# Patient Record
Sex: Male | Born: 1990
Health system: Southern US, Community
[De-identification: ages and names within clinical notes are randomized; demographics above are authoritative.]

## PROBLEM LIST (undated history)

## (undated) DIAGNOSIS — I10 Essential (primary) hypertension: Secondary | ICD-10-CM

## (undated) DIAGNOSIS — M549 Dorsalgia, unspecified: Secondary | ICD-10-CM

## (undated) HISTORY — PX: HIP SURGERY: SHX245

## (undated) HISTORY — PX: SHOULDER SURGERY: SHX246

---
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
## 2010-07-18 NOTE — Op Note (Signed)
NAME:  Samuel Greer, Samuel Greer NO.:  [id number]   MEDICAL RECORD NO.:  [id number]          PATIENT TYPE:  AMB   LOCATION:  DSC                          FACILITY:  [facility]   PHYSICIAN:  Loreta Ave, M.D. 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   PROCEDURE:  Left shoulder exam under anesthesia, arthroscopy,  debridement of articular cartilage loose bodies glenohumeral joint,  Bankart reconstruction utilizing Fiberlink suture x3 and pushed lock  anchor x3.   SURGEON:  Loreta Ave, M.D.   ASSISTANT:  Genene Churn. Barry Dienes, P.A.-C., present throughout the entire case.   ANESTHESIA:  General.   BLOOD LOSS:  Minimal.   SPECIMENS:  None.   COMPLICATIONS:  None.   DRESSINGS:  Soft compressive with shoulder immobilizer.   PROCEDURE IN DETAIL:  The patient was brought to the operating room and  placed on operating table in a supine position.  After adequate  anesthesia had been obtained, the left shoulder was examined.  Obvious  increased external rotation and anteroinferior instability left compared  right.  Placed in a beach chair position on the shoulder positioner and  prepped and draped in the usual sterile fashion.  Anterior and posterior  shoulder portals were created.  Shoulder entered with blunt obturator,  arthroscope, distended and inspected.  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  Remnants of labrum debrided.  Articular cartilage irregularity and loose bodies debrided off the  glenoid.  A relatively large Hill-Sachs lesion posterior humeral head  debrided.  Biceps tendon, biceps anchor, and rotator cuff intact.  The  front of the glenoid was roughened 1-2 cm down the neck where the  Bankart lesion was present.  This was done to heal bleeding bone.  I  then mobilized the capsular labral interface.  Utilizing the lasso  device, the capsular labral interface was captured with three Fiberlink  sutures.  These were then firmly brought up and into the front of the  glenoid and sutured in place on the front of the glenoid at the 1, 3,  and 5 o'clock position.  Sutures were cut flush.  At completion, I had  marked improved stability, still good motion, and a very nicely  reinforced anterior wall to obliterate the Bankart lesion.  Instruments  and fluid were all removed.  The large cannula  from the front of the shoulder removed.  The wound was irrigated and  closed with nylon.  The shoulder was injected with Marcaine.  Sterile  compressive dressing applied.  Shoulder immobilizer applied.  Anesthesia  reversed.  Brought to the recovery room.  Tolerated surgery well.  No  complications.      Loreta Ave, M.D.  Electronically Signed     DFM/MEDQ  D:  11/07/2006  T:  11/07/2006  Job:  [id number]

## 2010-07-21 NOTE — Discharge Summary (Signed)
[facility]  Patient:    Samuel Greer, Samuel Greer                      MRN: [id number] Adm. Date:  [id number] Disc. Date: [id number] Attending:  Colbert Ewing Dictator:   Oris Drone. Petrarca, P.A.-C.                           Discharge Summary  ADMITTING DIAGNOSIS:  Bilateral subcapital femoral epiphysis.  DISCHARGE DIAGNOSES: 1. Bilateral subcapital femoral epiphysis. 2. Exogenous obesity.  PROCEDURES:  Bilateral internal fixation of both hips.  HISTORY OF PRESENT ILLNESS:  This patient has had pain in both hips which was noted previously.  X-rays revealed subcapital femoral epiphysis and he was scheduled at this time for pinning.  HOSPITAL COURSE:  A 20 year old black male admitted Jul 19, 2000, and after appropriate laboratory studies were obtained, he was taken to the operating room where he underwent bilateral pinning of the subcapital femoral epiphysis. He tolerated the procedure well.  He was kept preoperatively and postoperatively on Ancef prophylaxis for one more dose postoperatively.  Consultation for PT for ambulation, touchdown weightbearing on the left with crutches or walker.  He had difficulty with ambulation and unfortunately it had took until the 20th of May to get him ambulatory.  He was complaining of significant pain in both his hips and inability to ambulate.  X-rays revealed no reveal change in the position of the epiphysis.  He was then able to ambulate with the physical therapist and was discharged on the 20th.  Laboratory studies showed a urinalysis which is benign.  DISCHARGE MEDICATIONS:  He was given a prescription for Tylenol No. 3 syrup, give three teaspoons q.4h. p.r.n. pain.  DISCHARGE INSTRUCTIONS:  Ambulate with crutches and walker.  Keep his dressing clean and dry until follow-up.  Call for follow-up in 7 to 10 days for recheck evaluation.  He was discharged on a house diet, though we have discussed calorie  reduction.  He was discharged in improved condition.DD:  07/29/00 TD:  07/29/00 Job: [id number]

## 2010-11-29 ENCOUNTER — Emergency Department (HOSPITAL_COMMUNITY)
Admission: EM | Admit: 2010-11-29 | Discharge: 2010-11-29 | Disposition: A | Discharge status: Home / Self Care | Payer: Self-pay | Attending: Emergency Medicine | Admitting: Emergency Medicine

## 2010-11-29 DIAGNOSIS — R51 Headache: Secondary | ICD-10-CM | POA: Insufficient documentation

## 2010-12-15 LAB — POCT HEMOGLOBIN-HEMACUE: Hemoglobin: 14.5

## 2011-02-16 ENCOUNTER — Emergency Department (HOSPITAL_COMMUNITY)
Admission: EM | Admit: 2011-02-16 | Discharge: 2011-02-16 | Disposition: A | Discharge status: Home / Self Care | Payer: Self-pay | Attending: Emergency Medicine | Admitting: Emergency Medicine

## 2011-02-16 ENCOUNTER — Encounter: Payer: Self-pay | Admitting: Emergency Medicine

## 2011-02-16 DIAGNOSIS — H109 Unspecified conjunctivitis: Secondary | ICD-10-CM | POA: Insufficient documentation

## 2011-02-16 DIAGNOSIS — H5789 Other specified disorders of eye and adnexa: Secondary | ICD-10-CM | POA: Insufficient documentation

## 2011-02-16 DIAGNOSIS — F17200 Nicotine dependence, unspecified, uncomplicated: Secondary | ICD-10-CM | POA: Insufficient documentation

## 2011-02-16 MED ORDER — TOBRAMYCIN 0.3 % OP SOLN: 1.0000 [drp] | OPHTHALMIC | Status: AC

## 2011-02-16 NOTE — ED Provider Notes (Signed)
History     CSN: [id number] Arrival date & time: 02/16/2011 10:20 PM   First MD Initiated Contact with Patient 02/16/11 2252      Chief Complaint  Patient presents with  . Eye Drainage    (Consider location/radiation/quality/duration/timing/severity/associated sxs/prior treatment) HPI  Patient presents to emergency department complaining of a 2 day history of red watery right eye. patient states he woke yesterday morning with drainage crusted to his right eye and noted his eyes are watering throughout the day yesterday and today. Patient states he woke again this morning with drainage crusted into his right eye that he removed with warm wash rag. Patient states throughout the day his eyes felt irritated however he denies any associated pain. States clear to white watery discharge from eye. Patient states that a few days prior he had cough and cold like symptoms stating scratchy throat, runny nose, and mild cough. Patient denies fevers, chills, facial swelling, headache, visual changes, pain with eye movement. Patient denies contact lens use or corrective lens use. Patient denies injury to eye. Past Medical History  Diagnosis Date  . Migraine     Past Surgical History  Procedure Date  . Hip surgery     Family History  Problem Relation Age of Onset  . Cancer Other   . Diabetes Other     History  Substance Use Topics  . Smoking status: Current Everyday Smoker -- 0.5 packs/day    Types: Cigarettes  . Smokeless tobacco: Not on file  . Alcohol Use: Yes     social      Review of Systems  All other systems reviewed and are negative.    Allergies  Review of patient's allergies indicates no known allergies.  Home Medications   Current Outpatient Rx  Name Route Sig Dispense Refill  . DM-GUAIFENESIN ER 30-600 MG PO TB12 Oral Take 1 tablet by mouth every 12 (twelve) hours.      Dorothyann Peng MULTI-SYMPTOM COLD/FLU PO Oral Take 2 capsules by mouth 1 day or 1 dose.        BP  125/76  Pulse 85  Temp(Src) 98.7 F (37.1 C) (Oral)  Resp 18  SpO2 99%  Physical Exam  Nursing note and vitals reviewed. Constitutional: He is oriented to person, place, and time. He appears well-developed and well-nourished. No distress.  HENT:  Head: Normocephalic and atraumatic.  Right Ear: External ear normal.  Left Ear: External ear normal.  Mouth/Throat: Oropharynx is clear and moist.  Eyes: EOM are normal. Pupils are equal, round, and reactive to light. Right eye exhibits discharge. Right conjunctiva is injected. Right conjunctiva has no hemorrhage. Left conjunctiva is not injected. Left conjunctiva has no hemorrhage.       Clear watery drainage to injected right eye.   Neck: Normal range of motion. Neck supple.  Cardiovascular: Normal rate, regular rhythm, normal heart sounds and intact distal pulses.  Exam reveals no gallop and no friction rub.   No murmur heard. Pulmonary/Chest: Effort normal and breath sounds normal. No respiratory distress. He has no wheezes. He has no rales. He exhibits no tenderness.  Musculoskeletal: Normal range of motion. He exhibits no edema and no tenderness.  Lymphadenopathy:    He has no cervical adenopathy.  Neurological: He is alert and oriented to person, place, and time.  Skin: Skin is warm and dry. No rash noted. He is not diaphoretic. No erythema.  Psychiatric: He has a normal mood and affect.    ED Course  Procedures (  including critical care time)  Labs Reviewed - No data to display No results found.   1. Conjunctivitis       MDM  No injury to eye with preceding upper respiratory like illness with likely viral conjunctivitis but will treat covering for both viral and bacterial. Patient has no pain with EOMIs and PERRLA without pain. Clear watery drainage.        Jenness Corner, [place] 02/16/11 2328

## 2011-02-16 NOTE — Discharge Instructions (Signed)
Use eyedrops as directed and use cool compresses as needed for comfort. Followup with eye doctor in the next 3-5 days for recheck of ongoing symptoms. However return to emergency department for emergent changing or worsening symptoms.  Conjunctivitis Conjunctivitis is commonly called "pink eye." Conjunctivitis can be caused by bacterial or viral infection, allergies, or injuries. There is usually redness of the lining of the eye, itching, discomfort, and sometimes discharge. There may be deposits of matter along the eyelids. A viral infection usually causes a watery discharge, while a bacterial infection causes a yellowish, thick discharge. Pink eye is very contagious and spreads by direct contact. You may be given antibiotic eyedrops as part of your treatment. Before using your eye medicine, remove all drainage from the eye by washing gently with warm water and cotton balls. Continue to use the medication until you have awakened 2 mornings in a row without discharge from the eye. Do not rub your eye. This increases the irritation and helps spread infection. Use separate towels from other household members. Wash your hands with soap and water before and after touching your eyes. Use cold compresses to reduce pain and sunglasses to relieve irritation from light. Do not wear contact lenses or wear eye makeup until the infection is gone. SEEK MEDICAL CARE IF:   Your symptoms are not better after 3 days of treatment.   You have increased pain or trouble seeing.   The outer eyelids become very red or swollen.  Document Released: 03/29/2004 Document Revised: 11/01/2010 Document Reviewed: 02/19/2005 [facility] Patient Information 2012 [place], [place].

## 2011-02-16 NOTE — ED Notes (Signed)
Pt states he is coming off a cold and now starting yesterday his right eye has been draining and red and feels irritated

## 2011-02-18 NOTE — ED Provider Notes (Signed)
Medical screening examination/treatment/procedure(s) were performed by non-physician practitioner and as supervising physician I was immediately available for consultation/collaboration.   Jae Skeet A. Patrica Duel, MD 02/18/11 1423

## 2011-08-31 DIAGNOSIS — R071 Chest pain on breathing: Secondary | ICD-10-CM | POA: Insufficient documentation

## 2011-09-01 ENCOUNTER — Encounter (HOSPITAL_COMMUNITY): Payer: Self-pay | Admitting: *Deleted

## 2011-09-01 ENCOUNTER — Emergency Department (HOSPITAL_COMMUNITY): Payer: Self-pay

## 2011-09-01 ENCOUNTER — Emergency Department (HOSPITAL_COMMUNITY)
Admission: EM | Admit: 2011-09-01 | Discharge: 2011-09-01 | Disposition: A | Discharge status: Home / Self Care | Payer: Self-pay | Attending: Emergency Medicine | Admitting: Emergency Medicine

## 2011-09-01 DIAGNOSIS — R0789 Other chest pain: Secondary | ICD-10-CM

## 2011-09-01 MED ORDER — KETOROLAC TROMETHAMINE 30 MG/ML IJ SOLN
30.0000 mg | Freq: Once | INTRAMUSCULAR | Status: AC
  Administered 2011-09-01: 30 mg via INTRAMUSCULAR
  Filled 2011-09-01: qty 1

## 2011-09-01 MED ORDER — NAPROXEN 375 MG PO TABS: 375.0000 mg | ORAL_TABLET | Freq: Two times a day (BID) | ORAL | Status: DC

## 2011-09-01 NOTE — ED Notes (Addendum)
C/o CP, describes as tightness, "stretches and feels a pop and it gets better", ongoing for ~ 1 month, worse tonight, worse in certain positions.  "Breathing in ans stretching lungs helps". LS CTA, diminished, poor respiritory effort. Alert, NAD, calm, interactive, skin W&D, resps e/u, shallow. Sx come and go, "does it when it wants to". (Denies cough fever congestion or cold sx, nvd).

## 2011-09-01 NOTE — ED Provider Notes (Signed)
History     CSN: [id number]  Arrival date & time 08/31/11  2353   First MD Initiated Contact with Patient 09/01/11 0106      Chief Complaint  Patient presents with  . Chest Pain    (Consider location/radiation/quality/duration/timing/severity/associated sxs/prior treatment) HPI Comments: Patient states he develops, nightly, left, anterior chest pain.  That has been getting worse over the last couple days.  He normally can raise his arms or breathe deeply to resolve.  This discomfort.  With these maneuvers have not been helping for the last couple days.  Has not taken any of the medication for his discomfort.  He denies a history of, asthma, recent URI symptoms, cough.  He does smoke.  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  Has not traveled is morbidly obese.  Patient is a 21 y.o. male presenting with chest pain. The history is provided by the patient.  Chest Pain The chest pain began more  than 1 month ago. Chest pain occurs frequently. The chest pain is worsening. At its most intense, the pain is at 8/10. The pain is currently at 8/10. The severity of the pain is moderate. Pertinent negatives for primary symptoms include no fever, no shortness of breath, no cough, no wheezing, no nausea, no vomiting and no dizziness.  Pertinent negatives for associated symptoms include no numbness and no weakness.     Past Medical History  Diagnosis Date  . Migraine     Past Surgical History  Procedure Date  . Hip surgery   . Shoulder surgery     left    Family History  Problem Relation Age of Onset  . Cancer Other   . Diabetes Other     History  Substance Use Topics  . Smoking status: Current Everyday Smoker -- 0.5 packs/day    Types: Cigarettes  . Smokeless tobacco: Not on file  . Alcohol Use: Yes     social      Review of Systems  Constitutional: Negative for fever and chills.  HENT: Negative for neck stiffness.   Respiratory: Negative for cough, shortness of breath  and wheezing.   Cardiovascular: Positive for chest pain.  Gastrointestinal: Negative for nausea and vomiting.  Skin: Negative for rash.  Neurological: Negative for dizziness, weakness and numbness.    Allergies  Review of patient's allergies indicates no known allergies.  Home Medications   Current Outpatient Rx  Name Route Sig Dispense Refill  . NAPROXEN 375 MG PO TABS Oral Take 1 tablet (375 mg total) by mouth 2 (two) times daily with a meal. 30 tablet 0    BP 128/82  Pulse 70  Temp 98.1 F (36.7 C) (Oral)  Resp 18  SpO2 99%  Physical Exam  Constitutional: He appears well-nourished.       Morbidly obese  HENT:  Head: Normocephalic.  Eyes: Pupils are equal, round, and reactive to light.  Neck: Normal range of motion.  Cardiovascular: Normal rate, regular rhythm and normal heart sounds.   No murmur heard. Pulmonary/Chest: Effort normal and breath sounds normal. He has no wheezes. He exhibits tenderness.  Abdominal: Soft.  Musculoskeletal: Normal range of motion.  Neurological: He is alert.  Skin: Skin is warm.    ED Course  Procedures (including critical care time)  Labs Reviewed - No data to display Dg Chest 2 View  09/01/2011  *RADIOLOGY REPORT*  Clinical Data: Shortness of breath  CHEST - 2 VIEW  Comparison: 07/07/2006  Findings: Lungs are clear.  No pleural effusion or pneumothorax. The cardiomediastinal contours are within normal limits. Mild wedging of the anterior aspect of a lower thoracic vertebral body is not favored to be acute.  The visualized bones and soft tissues are without acute appreciable abnormality.  IMPRESSION: No radiographic evidence of acute cardiopulmonary process.  Original Report Authenticated By: Waneta Martins, M.D.     1. Chest wall pain    ED ECG REPORT   Date: 09/01/2011  EKG Time: 2:38 AM  Rate: 60  Rhythm: normal sinus rhythm,  unchanged from previous tracings  Axis: normal  Intervals:none  ST&T Change: non specific ST an  dT wave abnormality  Narrative Interpretation: unchanged for May 2008             MDM   I feel this is most likely chest wall discomfort, as his pain is reproducible in the left upper pectoral area, but will obtain EKG, and chest x-ray [place], and chest x-ray, are normal.  I will try this patient on a short course of regular doses of Naprosyn      Arman Filter, NP 09/01/11 0238  Arman Filter, NP 09/01/11 [phone]

## 2011-09-01 NOTE — Discharge Instructions (Signed)
Chest Wall Pain °Chest wall pain is pain in or around the bones and muscles of your chest. It may take up to 6 weeks to get better. It may take longer if you must stay physically active in your work and activities.  °CAUSES  °Chest wall pain may happen on its own. However, it may be caused by: °· A viral illness like the flu.  °· Injury.  °· Coughing.  °· Exercise.  °· Arthritis.  °· Fibromyalgia.  °· Shingles.  °HOME CARE INSTRUCTIONS  °· Avoid overtiring physical activity. Try not to strain or perform activities that cause pain. This includes any activities using your chest or your abdominal and side muscles, especially if heavy weights are used.  °· Put ice on the sore area.  °· Put ice in a plastic bag.  °· Place a towel between your skin and the bag.  °· Leave the ice on for 15 to 20 minutes per hour while awake for the first 2 days.  °· Only take over-the-counter or prescription medicines for pain, discomfort, or fever as directed by your caregiver.  °SEEK IMMEDIATE MEDICAL CARE IF:  °· Your pain increases, or you are very uncomfortable.  °· You have a fever.  °· Your chest pain becomes worse.  °· You have new, unexplained symptoms.  °· You have nausea or vomiting.  °· You feel sweaty or lightheaded.  °· You have a cough with phlegm (sputum), or you cough up blood.  °MAKE SURE YOU:  °· Understand these instructions.  °· Will watch your condition.  °· Will get help right away if you are not doing well or get worse.  °Document Released: 02/19/2005 Document Revised: 02/08/2011 Document Reviewed: 10/16/2010 °[employer]® Patient Information ©2012 [employer].Chest Wall Pain °Chest wall pain is pain in or around the bones and muscles of your chest. It may take up to 6 weeks to get better. It may take longer if you must stay physically active in your work and activities.  °CAUSES  °Chest wall pain may happen on its own. However, it may be caused by: °· A viral illness like the flu.  °· Injury.  °· Coughing.   °· Exercise.  °· Arthritis.  °· Fibromyalgia.  °· Shingles.  °HOME CARE INSTRUCTIONS  °· Avoid overtiring physical activity. Try not to strain or perform activities that cause pain. This includes any activities using your chest or your abdominal and side muscles, especially if heavy weights are used.  °· Put ice on the sore area.  °· Put ice in a plastic bag.  °· Place a towel between your skin and the bag.  °· Leave the ice on for 15 to 20 minutes per hour while awake for the first 2 days.  °· Only take over-the-counter or prescription medicines for pain, discomfort, or fever as directed by your caregiver.  °SEEK IMMEDIATE MEDICAL CARE IF:  °· Your pain increases, or you are very uncomfortable.  °· You have a fever.  °· Your chest pain becomes worse.  °· You have new, unexplained symptoms.  °· You have nausea or vomiting.  °· You feel sweaty or lightheaded.  °· You have a cough with phlegm (sputum), or you cough up blood.  °MAKE SURE YOU:  °· Understand these instructions.  °· Will watch your condition.  °· Will get help right away if you are not doing well or get worse.  °Document Released: 02/19/2005 Document Revised: 02/08/2011 Document Reviewed: 10/16/2010 °[employer]® Patient Information ©2012 [employer]. °

## 2011-09-01 NOTE — ED Provider Notes (Signed)
Medical screening examination/treatment/procedure(s) were performed by non-physician practitioner and as supervising physician I was immediately available for consultation/collaboration.   Wilberto Console, MD 09/01/11 0708 

## 2012-01-03 ENCOUNTER — Emergency Department (HOSPITAL_COMMUNITY)
Admission: EM | Admit: 2012-01-03 | Discharge: 2012-01-03 | Disposition: A | Discharge status: Home / Self Care | Payer: Self-pay | Attending: Emergency Medicine | Admitting: Emergency Medicine

## 2012-01-03 ENCOUNTER — Emergency Department (HOSPITAL_COMMUNITY): Payer: Self-pay

## 2012-01-03 ENCOUNTER — Encounter (HOSPITAL_COMMUNITY): Payer: Self-pay | Admitting: Unknown Physician Specialty

## 2012-01-03 DIAGNOSIS — F17200 Nicotine dependence, unspecified, uncomplicated: Secondary | ICD-10-CM | POA: Insufficient documentation

## 2012-01-03 DIAGNOSIS — M545 Low back pain, unspecified: Secondary | ICD-10-CM | POA: Insufficient documentation

## 2012-01-03 DIAGNOSIS — N342 Other urethritis: Secondary | ICD-10-CM | POA: Insufficient documentation

## 2012-01-03 DIAGNOSIS — Z87442 Personal history of urinary calculi: Secondary | ICD-10-CM | POA: Insufficient documentation

## 2012-01-03 DIAGNOSIS — Z8669 Personal history of other diseases of the nervous system and sense organs: Secondary | ICD-10-CM | POA: Insufficient documentation

## 2012-01-03 DIAGNOSIS — R319 Hematuria, unspecified: Secondary | ICD-10-CM | POA: Insufficient documentation

## 2012-01-03 LAB — BASIC METABOLIC PANEL
Calcium: 9.7 mg/dL (ref 8.4–10.5)
Chloride: 104 mEq/L (ref 96–112)
Creatinine, Ser: 0.74 mg/dL (ref 0.50–1.35)
GFR calc non Af Amer: >90 mL/min (ref >90)
Glucose, Bld: 85 mg/dL (ref 70–99)
Potassium: 3.8 mEq/L (ref 3.5–5.1)
Sodium: 141 mEq/L (ref 135–145)

## 2012-01-03 LAB — URINALYSIS, ROUTINE W REFLEX MICROSCOPIC
Ketones, ur: NEGATIVE mg/dL
Nitrite: NEGATIVE
Protein, ur: 30 mg/dL — AB
Urobilinogen, UA: 1 mg/dL (ref 0.0–1.0)
pH: 6 (ref 5.0–8.0)

## 2012-01-03 LAB — CBC WITH DIFFERENTIAL/PLATELET
Basophils Absolute: 0 10*3/uL (ref 0.0–0.1)
Basophils Relative: 0 % (ref 0–1)
HCT: 40.3 % (ref 39.0–52.0)
Hemoglobin: 13.7 g/dL (ref 13.0–17.0)
MCH: 22.1 pg — ABNORMAL LOW (ref 26.0–34.0)
MCV: 64.9 fL — ABNORMAL LOW (ref 78.0–100.0)
Monocytes Relative: 8 % (ref 3–12)
Neutrophils Relative %: 64 % (ref 43–77)

## 2012-01-03 LAB — URINE MICROSCOPIC-ADD ON

## 2012-01-03 MED ORDER — CEFTRIAXONE SODIUM 250 MG IJ SOLR
250.0000 mg | Freq: Once | INTRAMUSCULAR | Status: AC
Start: 2012-01-03 — End: 2012-01-03
  Administered 2012-01-03: 250 mg via INTRAMUSCULAR
  Filled 2012-01-03: qty 250

## 2012-01-03 MED ORDER — LIDOCAINE HCL (PF) 1 % IJ SOLN
INTRAMUSCULAR | Status: AC
  Administered 2012-01-03: 1 mL
  Filled 2012-01-03: qty 5

## 2012-01-03 MED ORDER — AZITHROMYCIN 250 MG PO TABS
1000.0000 mg | ORAL_TABLET | Freq: Every day | ORAL | Status: DC
  Administered 2012-01-03: 1000 mg via ORAL
  Filled 2012-01-03: qty 5

## 2012-01-03 MED ORDER — CIPROFLOXACIN HCL 500 MG PO TABS: 500.0000 mg | ORAL_TABLET | Freq: Two times a day (BID) | ORAL | Status: DC
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
## 2012-01-03 NOTE — ED Notes (Signed)
Patient states he has been having difficulty urinating for the past week. He states it is hard to start the stream and then he feels a lot of pressure and he is urinating more frequently, he has lower back pain and he has had blood with clots three days ago. Denies fever or chills.

## 2012-07-08 ENCOUNTER — Emergency Department (HOSPITAL_COMMUNITY): Payer: Self-pay

## 2012-07-08 ENCOUNTER — Emergency Department (HOSPITAL_COMMUNITY)
Admission: EM | Admit: 2012-07-08 | Discharge: 2012-07-08 | Disposition: A | Discharge status: Home / Self Care | Payer: Self-pay | Attending: Emergency Medicine | Admitting: Emergency Medicine

## 2012-07-08 ENCOUNTER — Encounter (HOSPITAL_COMMUNITY): Payer: Self-pay

## 2012-07-08 DIAGNOSIS — Y929 Unspecified place or not applicable: Secondary | ICD-10-CM | POA: Insufficient documentation

## 2012-07-08 DIAGNOSIS — Y939 Activity, unspecified: Secondary | ICD-10-CM | POA: Insufficient documentation

## 2012-07-08 DIAGNOSIS — R296 Repeated falls: Secondary | ICD-10-CM | POA: Insufficient documentation

## 2012-07-08 DIAGNOSIS — Z8679 Personal history of other diseases of the circulatory system: Secondary | ICD-10-CM | POA: Insufficient documentation

## 2012-07-08 DIAGNOSIS — F17200 Nicotine dependence, unspecified, uncomplicated: Secondary | ICD-10-CM | POA: Insufficient documentation

## 2012-07-08 DIAGNOSIS — IMO0002 Reserved for concepts with insufficient information to code with codable children: Secondary | ICD-10-CM | POA: Insufficient documentation

## 2012-07-08 DIAGNOSIS — R51 Headache: Secondary | ICD-10-CM | POA: Insufficient documentation

## 2012-07-08 DIAGNOSIS — T148XXA Other injury of unspecified body region, initial encounter: Secondary | ICD-10-CM

## 2012-07-08 DIAGNOSIS — M79645 Pain in left finger(s): Secondary | ICD-10-CM

## 2012-07-08 MED ORDER — HYDROCODONE-ACETAMINOPHEN 5-325 MG PO TABS
1.0000 | ORAL_TABLET | Freq: Once | ORAL | Status: AC
  Administered 2012-07-08: 1 via ORAL
  Filled 2012-07-08: qty 1

## 2012-07-08 MED ORDER — IBUPROFEN 800 MG PO TABS: 800.0000 mg | ORAL_TABLET | Freq: Three times a day (TID) | ORAL | Status: DC
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
## 2012-07-08 NOTE — ED Provider Notes (Signed)
  Medical screening examination/treatment/procedure(s) were performed by non-physician practitioner and as supervising physician I was immediately available for consultation/collaboration.    Gerhard Munch, MD 07/08/12 [phone]
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
## 2012-08-19 ENCOUNTER — Encounter (HOSPITAL_COMMUNITY): Payer: Self-pay | Admitting: Emergency Medicine

## 2012-08-19 ENCOUNTER — Emergency Department (HOSPITAL_COMMUNITY)
Admission: EM | Admit: 2012-08-19 | Discharge: 2012-08-19 | Disposition: A | Discharge status: Home / Self Care | Payer: Self-pay | Attending: Emergency Medicine | Admitting: Emergency Medicine

## 2012-08-19 DIAGNOSIS — Z87891 Personal history of nicotine dependence: Secondary | ICD-10-CM | POA: Insufficient documentation

## 2012-08-19 DIAGNOSIS — Z791 Long term (current) use of non-steroidal anti-inflammatories (NSAID): Secondary | ICD-10-CM | POA: Insufficient documentation

## 2012-08-19 DIAGNOSIS — J3489 Other specified disorders of nose and nasal sinuses: Secondary | ICD-10-CM | POA: Insufficient documentation

## 2012-08-19 DIAGNOSIS — J069 Acute upper respiratory infection, unspecified: Secondary | ICD-10-CM | POA: Insufficient documentation

## 2012-08-19 DIAGNOSIS — Z8679 Personal history of other diseases of the circulatory system: Secondary | ICD-10-CM | POA: Insufficient documentation

## 2012-08-19 MED ORDER — FLUTICASONE PROPIONATE 50 MCG/ACT NA SUSP: 2.0000 | Freq: Every day | NASAL | Status: DC

## 2012-08-19 NOTE — ED Provider Notes (Signed)
Medical screening examination/treatment/procedure(s) were performed by non-physician practitioner and as supervising physician I was immediately available for consultation/collaboration.  Raeford Razor, MD 08/19/12 [phone]

## 2012-08-19 NOTE — ED Provider Notes (Signed)
History     CSN: [id number]  Arrival date & time 08/19/12  1213   First MD Initiated Contact with Patient 08/19/12 1320      Chief Complaint  Patient presents with  . Otalgia    (Consider location/radiation/quality/duration/timing/severity/associated sxs/prior treatment) HPI Comments: Patient presents with a chief complaint of bilateral ear pain, congestion, rhinorrhea.  Symptoms have been present for the 5 days and gradually worsening.  He denies fever or chills.  He reports that last week he had a cough, but he has not been coughing since that time.  He denies any sinus pain or pressure.  He has not taken anything for his symptoms prior to arrival.    The history is provided by the patient.    Past Medical History  Diagnosis Date  . Migraine     Past Surgical History  Procedure Laterality Date  . Hip surgery    . Shoulder surgery      left    Family History  Problem Relation Age of Onset  . Cancer Other   . Diabetes Other     History  Substance Use Topics  . Smoking status: Former Smoker -- 0.50 packs/day    Types: Cigarettes  . Smokeless tobacco: Not on file  . Alcohol Use: No     Comment: social      Review of Systems  Constitutional: Negative for fever and chills.  HENT: Positive for ear pain, congestion and rhinorrhea. Negative for sinus pressure and ear discharge.   All other systems reviewed and are negative.    Allergies  Review of patient's allergies indicates no known allergies.  Home Medications   Current Outpatient Rx  Name  Route  Sig  Dispense  Refill  . ibuprofen (ADVIL,MOTRIN) 800 MG tablet   Oral   Take 1 tablet (800 mg total) by mouth 3 (three) times daily.   21 tablet   0     BP 135/69  Pulse 74  Temp(Src) 98.7 F (37.1 C) (Oral)  Resp 18  SpO2 99%  Physical Exam  Nursing note and vitals reviewed. Constitutional: He appears well-developed and well-nourished.  HENT:  Head: Normocephalic and atraumatic.  Right Ear:  Hearing, tympanic membrane, external ear and ear canal normal.  Left Ear: Hearing, tympanic membrane, external ear and ear canal normal.  Nose: Mucosal edema and rhinorrhea present. Right sinus exhibits no maxillary sinus tenderness and no frontal sinus tenderness. Left sinus exhibits no maxillary sinus tenderness and no frontal sinus tenderness.  Mouth/Throat: Uvula is midline, oropharynx is clear and moist and mucous membranes are normal.  Cardiovascular: Normal rate, regular rhythm and normal heart sounds.   Pulmonary/Chest: Effort normal and breath sounds normal.  Neurological: He is alert.  Skin: Skin is warm and dry.  Psychiatric: He has a normal mood and affect.    ED Course  Procedures (including critical care time)  Labs Reviewed - No data to display No results found.   No diagnosis found.    MDM  Patient presents with a chief complaint of bilateral ear pain, congestion, and rhinorrhea.  No signs of ear infection at this time.  Symptoms most consistent with URI.          Pascal Lux Banc[place], PA-C 08/19/12 1515

## 2012-08-19 NOTE — ED Notes (Signed)
Saturday states had left ear pain and now has moved to right ear also, c/o fullness in ear, some drainage left ear

## 2012-08-19 NOTE — Progress Notes (Signed)
[facility] has seen patient and provided him with a list of primary care resources. °

## 2012-12-15 ENCOUNTER — Encounter (HOSPITAL_COMMUNITY): Payer: Self-pay | Admitting: Emergency Medicine

## 2012-12-15 ENCOUNTER — Emergency Department (HOSPITAL_COMMUNITY)
Admission: EM | Admit: 2012-12-15 | Discharge: 2012-12-15 | Disposition: A | Discharge status: Home / Self Care | Payer: No Typology Code available for payment source | Attending: Emergency Medicine | Admitting: Emergency Medicine

## 2012-12-15 DIAGNOSIS — M25559 Pain in unspecified hip: Secondary | ICD-10-CM | POA: Insufficient documentation

## 2012-12-15 DIAGNOSIS — Y92410 Unspecified street and highway as the place of occurrence of the external cause: Secondary | ICD-10-CM | POA: Insufficient documentation

## 2012-12-15 DIAGNOSIS — Y9389 Activity, other specified: Secondary | ICD-10-CM | POA: Insufficient documentation

## 2012-12-15 DIAGNOSIS — F17200 Nicotine dependence, unspecified, uncomplicated: Secondary | ICD-10-CM | POA: Insufficient documentation

## 2012-12-15 DIAGNOSIS — E669 Obesity, unspecified: Secondary | ICD-10-CM | POA: Insufficient documentation

## 2012-12-15 DIAGNOSIS — Z8739 Personal history of other diseases of the musculoskeletal system and connective tissue: Secondary | ICD-10-CM | POA: Insufficient documentation

## 2012-12-15 MED ORDER — METHOCARBAMOL 500 MG PO TABS: 500.0000 mg | ORAL_TABLET | Freq: Two times a day (BID) | ORAL | Status: DC

## 2012-12-15 MED ORDER — IBUPROFEN 800 MG PO TABS: 800.0000 mg | ORAL_TABLET | Freq: Three times a day (TID) | ORAL | Status: DC
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
## 2012-12-16 NOTE — ED Provider Notes (Signed)
Medical screening examination/treatment/procedure(s) were performed by non-physician practitioner and as supervising physician I was immediately available for consultation/collaboration.   Roney Marion, MD 12/16/12 2211

## 2013-01-21 ENCOUNTER — Encounter (HOSPITAL_COMMUNITY): Payer: Self-pay | Admitting: Emergency Medicine

## 2013-01-21 ENCOUNTER — Emergency Department (HOSPITAL_COMMUNITY)
Admission: EM | Admit: 2013-01-21 | Discharge: 2013-01-21 | Disposition: A | Discharge status: Home / Self Care | Payer: No Typology Code available for payment source | Attending: Emergency Medicine | Admitting: Emergency Medicine

## 2013-01-21 DIAGNOSIS — F17200 Nicotine dependence, unspecified, uncomplicated: Secondary | ICD-10-CM | POA: Insufficient documentation

## 2013-01-21 DIAGNOSIS — N341 Nonspecific urethritis: Secondary | ICD-10-CM | POA: Insufficient documentation

## 2013-01-21 DIAGNOSIS — N342 Other urethritis: Secondary | ICD-10-CM

## 2013-01-21 DIAGNOSIS — Z113 Encounter for screening for infections with a predominantly sexual mode of transmission: Secondary | ICD-10-CM | POA: Insufficient documentation

## 2013-01-21 MED ORDER — CEFTRIAXONE SODIUM 250 MG IJ SOLR
250.0000 mg | Freq: Once | INTRAMUSCULAR | Status: AC
  Administered 2013-01-21: 250 mg via INTRAMUSCULAR
  Filled 2013-01-21: qty 250

## 2013-01-21 MED ORDER — AZITHROMYCIN 250 MG PO TABS
1000.0000 mg | ORAL_TABLET | Freq: Once | ORAL | Status: AC
  Administered 2013-01-21: 1000 mg via ORAL
  Filled 2013-01-21: qty 4

## 2013-01-21 NOTE — ED Provider Notes (Signed)
Medical screening examination/treatment/procedure(s) were performed by non-physician practitioner and as supervising physician I was immediately available for consultation/collaboration.  EKG Interpretation   None       Devoria Albe, MD, Armando Gang   Ward Givens, MD 01/21/13 1328
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
## 2013-01-21 NOTE — ED Provider Notes (Deleted)
CSN: [id number]     Arrival date & time 01/21/13  1234 History   First MD Initiated Contact with Patient 01/21/13 1302     No chief complaint on file.  (Consider location/radiation/quality/duration/timing/severity/associated sxs/prior Treatment) HPI  Past Medical History  Diagnosis Date  . Migraine    Past Surgical History  Procedure Laterality Date  . Hip surgery    . Shoulder surgery      left   Family History  Problem Relation Age of Onset  . Cancer Other   . Diabetes Other    History  Substance Use Topics  . Smoking status: Current Every Day Smoker -- 0.50 packs/day    Types: Cigarettes  . Smokeless tobacco: Not on file  . Alcohol Use: Yes     Comment: social    Review of Systems  Allergies  Review of patient's allergies indicates no known allergies.  Home Medications  No current outpatient prescriptions on file. BP 147/90  Pulse 77  Temp(Src) 98.1 F (36.7 C) (Oral)  Resp 16  SpO2 100% Physical Exam  ED Course  Procedures (including critical care time) Labs Review Labs Reviewed  GC/CHLAMYDIA PROBE AMP   Imaging Review No results found.  EKG Interpretation   None       MDM   1. Urethritis    zithromax and rocephin   I personally performed the services in this documentation, which was scribed in my presence.  The recorded information has been reviewed and considered.   Barnet Pall.  Lonia Skinner [place], PA-C 01/21/13 1318

## 2013-01-21 NOTE — Progress Notes (Signed)
[facility] did not get to see patient but will be sending information about the [facility] [employer] Card program, using the address provided.  °

## 2013-01-21 NOTE — ED Provider Notes (Signed)
CSN: [id number]     Arrival date & time 01/21/13  1234 History  This chart was scribed for non-physician practitioner Elson Areas, PA-C working with Ward Givens, MD by Leone Payor, ED Scribe. This patient was seen in room WTR9/WTR9 and the patient's care was started at 1234.    No chief complaint on file.   Patient is a 22 y.o. male presenting with penile discharge. The history is provided by the patient. No language interpreter was used.  Penile Discharge This is a new problem. The current episode started more than 2 days ago. The problem occurs constantly. The problem has been gradually worsening. He has tried nothing for the symptoms.    HPI Comments: Samuel Greer is a 22 y.o. male who presents to the Emergency Department complaining of about 1 week of gradual onset, gradually worsening, constant penile discharge. Pt states he had unprotected sex about 2 weeks ago. Pt states he has spoken with his last sexual partner who has not noticed any symptoms. He denies any pertinent past medical history. He denies allergies to any medications. He denies dysuria.    Past Medical History  Diagnosis Date  . Migraine    Past Surgical History  Procedure Laterality Date  . Hip surgery    . Shoulder surgery      left   Family History  Problem Relation Age of Onset  . Cancer Other   . Diabetes Other    History  Substance Use Topics  . Smoking status: Current Every Day Smoker -- 0.50 packs/day    Types: Cigarettes  . Smokeless tobacco: Not on file  . Alcohol Use: Yes     Comment: social    Review of Systems  Constitutional: Negative for fever.  Genitourinary: Positive for discharge. Negative for testicular pain.  All other systems reviewed and are negative.    Allergies  Review of patient's allergies indicates no known allergies.  Home Medications  No current outpatient prescriptions on file. BP 147/90  Pulse 77  Temp(Src) 98.1 F (36.7 C) (Oral)  Resp 16  SpO2  100% Physical Exam  Nursing note and vitals reviewed. Constitutional: He is oriented to person, place, and time. He appears well-developed and well-nourished.  HENT:  Head: Normocephalic and atraumatic.  Eyes: EOM are normal.  Neck: Normal range of motion.  Cardiovascular: Normal rate.   Pulmonary/Chest: Effort normal.  Abdominal: He exhibits no distension.  Genitourinary: Discharge found.  Small amount of white discharge. No penile lesions.   Musculoskeletal: Normal range of motion.  Neurological: He is alert and oriented to person, place, and time.  Skin: Skin is warm and dry.  Psychiatric: He has a normal mood and affect.    ED Course  Procedures  DIAGNOSTIC STUDIES: Oxygen Saturation is 100% on RA, normal by my interpretation.    COORDINATION OF CARE: 12:56 PM Discussed treatment plan with pt at bedside and pt agreed to plan.    Labs Review Labs Reviewed - No data to display Imaging Review No results found.  EKG Interpretation   None       MDM   1. Urethritis     I personally performed the services in this documentation, which was scribed in my presence.  The recorded information has been reviewed and considered.   Barnet Pall.  Will go ahead and treat pt with Rocephin and Zithromax for STD. Cultures are pending.      Lonia Skinner Dumont, PA-C 01/21/13 1321

## 2013-01-23 ENCOUNTER — Telehealth (HOSPITAL_COMMUNITY): Payer: Self-pay | Admitting: Emergency Medicine

## 2013-01-23 NOTE — ED Notes (Signed)
Patient has +Chlamydia. 

## 2013-01-23 NOTE — ED Notes (Signed)
+  Chlamydia. Patient treated with Rocephin and Zithromax. [facility] faxed. 

## 2014-10-31 ENCOUNTER — Emergency Department (HOSPITAL_COMMUNITY)
Admission: EM | Admit: 2014-10-31 | Discharge: 2014-10-31 | Disposition: A | Discharge status: Home / Self Care | Payer: Self-pay | Attending: Emergency Medicine | Admitting: Emergency Medicine

## 2014-10-31 ENCOUNTER — Encounter (HOSPITAL_COMMUNITY): Payer: Self-pay

## 2014-10-31 ENCOUNTER — Emergency Department (HOSPITAL_COMMUNITY): Payer: Self-pay

## 2014-10-31 DIAGNOSIS — M25561 Pain in right knee: Secondary | ICD-10-CM | POA: Insufficient documentation

## 2014-10-31 DIAGNOSIS — Z8679 Personal history of other diseases of the circulatory system: Secondary | ICD-10-CM | POA: Insufficient documentation

## 2014-10-31 DIAGNOSIS — Z202 Contact with and (suspected) exposure to infections with a predominantly sexual mode of transmission: Secondary | ICD-10-CM | POA: Insufficient documentation

## 2014-10-31 DIAGNOSIS — Z720 Tobacco use: Secondary | ICD-10-CM | POA: Insufficient documentation

## 2014-10-31 DIAGNOSIS — R079 Chest pain, unspecified: Secondary | ICD-10-CM | POA: Insufficient documentation

## 2014-10-31 LAB — I-STAT CHEM 8, ED
BUN: 9 mg/dL (ref 6–20)
CALCIUM ION: 1.21 mmol/L (ref 1.12–1.23)
Chloride: 103 mmol/L (ref 101–111)
Creatinine, Ser: 0.9 mg/dL (ref 0.61–1.24)
Glucose, Bld: 85 mg/dL (ref 65–99)
HEMATOCRIT: 46 % (ref 39.0–52.0)
HEMOGLOBIN: 15.6 g/dL (ref 13.0–17.0)
Potassium: 3.9 mmol/L (ref 3.5–5.1)
SODIUM: 144 mmol/L (ref 135–145)
TCO2: 25 mmol/L (ref 0–100)

## 2014-10-31 LAB — I-STAT TROPONIN, ED: Troponin i, poc: 0 ng/mL (ref 0.00–0.08)

## 2014-10-31 MED ORDER — ALBUTEROL SULFATE HFA 108 (90 BASE) MCG/ACT IN AERS
2.0000 | INHALATION_SPRAY | Freq: Once | RESPIRATORY_TRACT | Status: AC
  Administered 2014-10-31: 2 via RESPIRATORY_TRACT
  Filled 2014-10-31: qty 6.7

## 2014-10-31 MED ORDER — CEFTRIAXONE SODIUM 250 MG IJ SOLR
250.0000 mg | Freq: Once | INTRAMUSCULAR | Status: AC
  Administered 2014-10-31: 250 mg via INTRAMUSCULAR
  Filled 2014-10-31: qty 250

## 2014-10-31 MED ORDER — AZITHROMYCIN 250 MG PO TABS
1000.0000 mg | ORAL_TABLET | Freq: Once | ORAL | Status: AC
  Administered 2014-10-31: 1000 mg via ORAL
  Filled 2014-10-31: qty 4

## 2014-10-31 MED ORDER — LIDOCAINE HCL 1 % IJ SOLN
INTRAMUSCULAR | Status: AC
  Administered 2014-10-31: 2 mL
  Filled 2014-10-31: qty 20

## 2014-10-31 NOTE — ED Notes (Signed)
Partner tested positive for Chlamydia.  He is here to be treated but is not having any symptoms.

## 2014-10-31 NOTE — Discharge Instructions (Signed)
Use inhaler 2 puffs every 4 hours for chest tightness. Stop smoking. Take ibuprofen for your knee pain. See below for knee exercises. Follow up with her primary care doctor for further evaluation.  Chest Pain (Nonspecific) It is often hard to give a specific diagnosis for the cause of chest pain. There is always a chance that your pain could be related to something serious, such as a heart attack or a blood clot in the lungs. You need to follow up with your health care provider for further evaluation. CAUSES   Heartburn.  Pneumonia or bronchitis.  Anxiety or stress.  Inflammation around your heart (pericarditis) or lung (pleuritis or pleurisy).  A blood clot in the lung.  A collapsed lung (pneumothorax). It can develop suddenly on its own (spontaneous pneumothorax) or from trauma to the chest.  Shingles infection (herpes zoster virus). The chest wall is composed of bones, muscles, and cartilage. Any of these can be the source of the pain.  The bones can be bruised by injury.  The muscles or cartilage can be strained by coughing or overwork.  The cartilage can be affected by inflammation and become sore (costochondritis). DIAGNOSIS  Lab tests or other studies may be needed to find the cause of your pain. Your health care provider may have you take a test called an ambulatory electrocardiogram (ECG). An ECG records your heartbeat patterns over a 24-hour period. You may also have other tests, such as:  Transthoracic echocardiogram (TTE). During echocardiography, sound waves are used to evaluate how blood flows through your heart.  Transesophageal echocardiogram (TEE).  Cardiac monitoring. This allows your health care provider to monitor your heart rate and rhythm in real time.  Holter monitor. This is a portable device that records your heartbeat and can help diagnose heart arrhythmias. It allows your health care provider to track your heart activity for several days, if  needed.  Stress tests by exercise or by giving medicine that makes the heart beat faster. TREATMENT   Treatment depends on what may be causing your chest pain. Treatment may include:  Acid blockers for heartburn.  Anti-inflammatory medicine.  Pain medicine for inflammatory conditions.  Antibiotics if an infection is present.  You may be advised to change lifestyle habits. This includes stopping smoking and avoiding alcohol, caffeine, and chocolate.  You may be advised to keep your head raised (elevated) when sleeping. This reduces the chance of acid going backward from your stomach into your esophagus. Most of the time, nonspecific chest pain will improve within 2-3 days with rest and mild pain medicine.  HOME CARE INSTRUCTIONS   If antibiotics were prescribed, take them as directed. Finish them even if you start to feel better.  For the next few days, avoid physical activities that bring on chest pain. Continue physical activities as directed.  Do not use any tobacco products, including cigarettes, chewing tobacco, or electronic cigarettes.  Avoid drinking alcohol.  Only take medicine as directed by your health care provider.  Follow your health care provider's suggestions for further testing if your chest pain does not go away.  Keep any follow-up appointments you made. If you do not go to an appointment, you could develop lasting (chronic) problems with pain. If there is any problem keeping an appointment, call to reschedule. SEEK MEDICAL CARE IF:   Your chest pain does not go away, even after treatment.  You have a rash with blisters on your chest.  You have a fever. SEEK IMMEDIATE MEDICAL CARE IF:  You have increased chest pain or pain that spreads to your arm, neck, jaw, back, or abdomen.  You have shortness of breath.  You have an increasing cough, or you cough up blood.  You have severe back or abdominal pain.  You feel nauseous or vomit.  You have severe  weakness.  You faint.  You have chills. This is an emergency. Do not wait to see if the pain will go away. Get medical help at once. Call your local emergency services (911 in [place] Do not drive yourself to the hospital. MAKE SURE YOU:   Understand these instructions.  Will watch your condition.  Will get help right away if you are not doing well or get worse. Document Released: 11/29/2004 Document Revised: 02/24/2013 Document Reviewed: 09/25/2007 [facility] Patient Information 2015 [place], [place]. This information is not intended to replace advice given to you by your health care provider. Make sure you discuss any questions you have with your health care provider.  Knee Exercises EXERCISES RANGE OF MOTION (ROM) AND STRETCHING EXERCISES These exercises may help you when beginning to rehabilitate your injury. Your symptoms may resolve with or without further involvement from your physician, physical therapist, or athletic trainer. While completing these exercises, remember:   Restoring tissue flexibility helps normal motion to return to the joints. This allows healthier, less painful movement and activity.  An effective stretch should be held for at least 30 seconds.  A stretch should never be painful. You should only feel a gentle lengthening or release in the stretched tissue. STRETCH - Knee Extension, Prone  Lie on your stomach on a firm surface, such as a bed or countertop. Place your right / left knee and leg just beyond the edge of the surface. You may wish to place a towel under the far end of your right / left thigh for comfort.  Relax your leg muscles and allow gravity to straighten your knee. Your clinician may advise you to add an ankle weight if more resistance is helpful for you.  You should feel a stretch in the back of your right / left knee. Hold this position for __________ seconds. Repeat __________ times. Complete this stretch __________ times per day. * Your  physician, physical therapist, or athletic trainer may ask you to add ankle weight to enhance your stretch.  RANGE OF MOTION - Knee Flexion, Active  Lie on your back with both knees straight. (If this causes back discomfort, bend your opposite knee, placing your foot flat on the floor.)  Slowly slide your heel back toward your buttocks until you feel a gentle stretch in the front of your knee or thigh.  Hold for __________ seconds. Slowly slide your heel back to the starting position. Repeat __________ times. Complete this exercise __________ times per day.  STRETCH - Quadriceps, Prone   Lie on your stomach on a firm surface, such as a bed or padded floor.  Bend your right / left knee and grasp your ankle. If you are unable to reach your ankle or pant leg, use a belt around your foot to lengthen your reach.  Gently pull your heel toward your buttocks. Your knee should not slide out to the side. You should feel a stretch in the front of your thigh and/or knee.  Hold this position for __________ seconds. Repeat __________ times. Complete this stretch __________ times per day.  STRETCH - Hamstrings, Supine   Lie on your back. Loop a belt or towel over the ball of  your right / left foot.  Straighten your right / left knee and slowly pull on the belt to raise your leg. Do not allow the right / left knee to bend. Keep your opposite leg flat on the floor.  Raise the leg until you feel a gentle stretch behind your right / left knee or thigh. Hold this position for __________ seconds. Repeat __________ times. Complete this stretch __________ times per day.  STRENGTHENING EXERCISES These exercises may help you when beginning to rehabilitate your injury. They may resolve your symptoms with or without further involvement from your physician, physical therapist, or athletic trainer. While completing these exercises, remember:   Muscles can gain both the endurance and the strength needed for everyday  activities through controlled exercises.  Complete these exercises as instructed by your physician, physical therapist, or athletic trainer. Progress the resistance and repetitions only as guided.  You may experience muscle soreness or fatigue, but the pain or discomfort you are trying to eliminate should never worsen during these exercises. If this pain does worsen, stop and make certain you are following the directions exactly. If the pain is still present after adjustments, discontinue the exercise until you can discuss the trouble with your clinician. STRENGTH - Quadriceps, Isometrics  Lie on your back with your right / left leg extended and your opposite knee bent.  Gradually tense the muscles in the front of your right / left thigh. You should see either your knee cap slide up toward your hip or increased dimpling just above the knee. This motion will push the back of the knee down toward the floor/mat/bed on which you are lying.  Hold the muscle as tight as you can without increasing your pain for __________ seconds.  Relax the muscles slowly and completely in between each repetition. Repeat __________ times. Complete this exercise __________ times per day.  STRENGTH - Quadriceps, Short Arcs   Lie on your back. Place a __________ inch towel roll under your knee so that the knee slightly bends.  Raise only your lower leg by tightening the muscles in the front of your thigh. Do not allow your thigh to rise.  Hold this position for __________ seconds. Repeat __________ times. Complete this exercise __________ times per day.  OPTIONAL ANKLE WEIGHTS: Begin with ____________________, but DO NOT exceed ____________________. Increase in 1 pound/0.5 kilogram increments.  STRENGTH - Quadriceps, Straight Leg Raises  Quality counts! Watch for signs that the quadriceps muscle is working to insure you are strengthening the correct muscles and not "cheating" by substituting with healthier  muscles.  Lay on your back with your right / left leg extended and your opposite knee bent.  Tense the muscles in the front of your right / left thigh. You should see either your knee cap slide up or increased dimpling just above the knee. Your thigh may even quiver.  Tighten these muscles even more and raise your leg 4 to 6 inches off the floor. Hold for __________ seconds.  Keeping these muscles tense, lower your leg.  Relax the muscles slowly and completely in between each repetition. Repeat __________ times. Complete this exercise __________ times per day.  STRENGTH - Hamstring, Curls  Lay on your stomach with your legs extended. (If you lay on a bed, your feet may hang over the edge.)  Tighten the muscles in the back of your thigh to bend your right / left knee up to 90 degrees. Keep your hips flat on the bed/floor.  Hold this  position for __________ seconds.  Slowly lower your leg back to the starting position. Repeat __________ times. Complete this exercise __________ times per day.  OPTIONAL ANKLE WEIGHTS: Begin with ____________________, but DO NOT exceed ____________________. Increase in 1 pound/0.5 kilogram increments.  STRENGTH - Quadriceps, Squats  Stand in a door frame so that your feet and knees are in line with the frame.  Use your hands for balance, not support, on the frame.  Slowly lower your weight, bending at the hips and knees. Keep your lower legs upright so that they are parallel with the door frame. Squat only within the range that does not increase your knee pain. Never let your hips drop below your knees.  Slowly return upright, pushing with your legs, not pulling with your hands. Repeat __________ times. Complete this exercise __________ times per day.  STRENGTH - Quadriceps, Wall Slides  Follow guidelines for form closely. Increased knee pain often results from poorly placed feet or knees.  Lean against a smooth wall or door and walk your feet out  18-24 inches. Place your feet hip-width apart.  Slowly slide down the wall or door until your knees bend __________ degrees.* Keep your knees over your heels, not your toes, and in line with your hips, not falling to either side.  Hold for __________ seconds. Stand up to rest for __________ seconds in between each repetition. Repeat __________ times. Complete this exercise __________ times per day. * Your physician, physical therapist, or athletic trainer will alter this angle based on your symptoms and progress. Document Released: 01/03/2005 Document Revised: 07/06/2013 Document Reviewed: 06/03/2008 [facility] Patient Information 2015 [place], [place]. This information is not intended to replace advice given to you by your health care provider. Make sure you discuss any questions you have with your health care provider.

## 2014-10-31 NOTE — ED Notes (Signed)
Pt wanting checked for chlamydia.  Pt girlfriend just diagnosed.  No discharge or painful urination noted.  Pt also states chest pain for about a week.  Hurts when bending over.  Hurts with breathing.  Pt states rt knee pain x 1 month.  No cause for injury
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
## 2015-04-11 ENCOUNTER — Emergency Department (HOSPITAL_COMMUNITY)
Admission: EM | Admit: 2015-04-11 | Discharge: 2015-04-12 | Disposition: A | Discharge status: Home / Self Care | Payer: Self-pay | Attending: Emergency Medicine | Admitting: Emergency Medicine

## 2015-04-11 DIAGNOSIS — G43009 Migraine without aura, not intractable, without status migrainosus: Secondary | ICD-10-CM

## 2015-04-11 DIAGNOSIS — G43909 Migraine, unspecified, not intractable, without status migrainosus: Secondary | ICD-10-CM | POA: Insufficient documentation

## 2015-04-11 DIAGNOSIS — Z79899 Other long term (current) drug therapy: Secondary | ICD-10-CM | POA: Insufficient documentation

## 2015-04-11 DIAGNOSIS — F17210 Nicotine dependence, cigarettes, uncomplicated: Secondary | ICD-10-CM | POA: Insufficient documentation

## 2015-04-11 NOTE — ED Notes (Addendum)
Pt states he has a hx of headaches but has also uncontrolled hypertension. Alert and oriented. Neuro intact.

## 2015-04-12 MED ORDER — SODIUM CHLORIDE 0.9 % IV BOLUS (SEPSIS)
1000.0000 mL | Freq: Once | INTRAVENOUS | Status: AC
  Administered 2015-04-12: 1000 mL via INTRAVENOUS

## 2015-04-12 MED ORDER — DIPHENHYDRAMINE HCL 50 MG/ML IJ SOLN
50.0000 mg | Freq: Once | INTRAMUSCULAR | Status: AC
  Administered 2015-04-12: 50 mg via INTRAVENOUS
  Filled 2015-04-12: qty 1

## 2015-04-12 MED ORDER — KETOROLAC TROMETHAMINE 30 MG/ML IJ SOLN
30.0000 mg | Freq: Once | INTRAMUSCULAR | Status: AC
  Administered 2015-04-12: 30 mg via INTRAVENOUS
  Filled 2015-04-12: qty 1

## 2015-04-12 MED ORDER — BUTALBITAL-APAP-CAFFEINE 50-325-40 MG PO TABS: 1.0000 | ORAL_TABLET | Freq: Four times a day (QID) | ORAL | Status: AC | PRN

## 2015-04-12 MED ORDER — METOCLOPRAMIDE HCL 5 MG/ML IJ SOLN
10.0000 mg | Freq: Once | INTRAMUSCULAR | Status: AC
  Administered 2015-04-12: 10 mg via INTRAVENOUS
  Filled 2015-04-12: qty 2

## 2015-04-12 MED ORDER — PROMETHAZINE HCL 25 MG PO TABS: 25.0000 mg | ORAL_TABLET | Freq: Four times a day (QID) | ORAL | Status: DC | PRN

## 2015-04-12 NOTE — ED Provider Notes (Signed)
By signing my name below, I, Marisue Humble, attest that this documentation has been prepared under the direction and in the presence of Jailynn Lavalais N Artemisia Auvil, DO . Electronically Signed: Marisue Humble, Scribe. 04/12/2015. 4:15 AM.  TIME SEEN: 4:04 AM  CHIEF COMPLAINT: Headache, Hypertension  HPI: HPI Comments:  Samuel Greer is a 25 y.o. male with PMHx of migraines who presents to the Emergency Department complaining of diffuse, 6/10 headache onset earlier today. Pt reports associated nausea earlier today. He notes headache is improved by dark and quiet. Pt states his migraines are usually caused by episodes of hypertension; he reports a BP 150/91 PTA.  He usually treats his migraines with a few tbs of vinegar every few hours; pt has been treated in ED previously for migraines with pills. States that he has not tried Tylenol or ibuprofen as these don't work for him. Pt denies prescription for blood pressure medication, but notes he has taken blood pressure medications prescribed to relatives. He has never been on daily antihypertensives. Pt is a current, everyday smoker. He denies fever or numbness/weakness on one side of the body. No head injury.  ROS: See HPI Constitutional: no fever  Eyes: no drainage  ENT: no runny nose   Cardiovascular:  no chest pain  Resp: no SOB  GI: no vomiting GU: no dysuria Integumentary: no rash  Allergy: no hives  Musculoskeletal: no leg swelling  Neurological: no slurred speech ROS otherwise negative  PAST MEDICAL HISTORY/PAST SURGICAL HISTORY:  Past Medical History  Diagnosis Date  . Migraine     MEDICATIONS:  Prior to Admission medications   Medication Sig Start Date End Date Taking? Authorizing Provider  albuterol (PROVENTIL HFA;VENTOLIN HFA) 108 (90 Base) MCG/ACT inhaler Inhale 2 puffs into the lungs every 6 (six) hours as needed for wheezing or shortness of breath.   Yes Historical Provider, MD    ALLERGIES:  No Known Allergies  SOCIAL  HISTORY:  Social History  Substance Use Topics  . Smoking status: Current Every Day Smoker -- 0.50 packs/day    Types: Cigarettes  . Smokeless tobacco: Not on file  . Alcohol Use: Yes     Comment: social    FAMILY HISTORY: Family History  Problem Relation Age of Onset  . Cancer Other   . Diabetes Other     EXAM: BP 147/100 mmHg  Pulse 75  Temp(Src) 98.5 F (36.9 C) (Oral)  Resp 20  SpO2 98% CONSTITUTIONAL: Alert and oriented and responds appropriately to questions. Well-appearing; well-nourished HEAD: Normocephalic EYES: Conjunctivae clear, PERRL ENT: normal nose; no rhinorrhea; moist mucous membranes; pharynx without lesions noted NECK: Supple, no meningismus, no LAD  CARD: RRR; S1 and S2 appreciated; no murmurs, no clicks, no rubs, no gallops RESP: Normal chest excursion without splinting or tachypnea; breath sounds clear and equal bilaterally; no wheezes, no rhonchi, no rales, no hypoxia or respiratory distress, speaking full sentences ABD/GI: Normal bowel sounds; non-distended; soft, non-tender, no rebound, no guarding, no peritoneal signs BACK:  The back appears normal and is non-tender to palpation, there is no CVA tenderness EXT: Normal ROM in all joints; non-tender to palpation; no edema; normal capillary refill; no cyanosis, no calf tenderness or swelling    SKIN: Normal color for age and race; warm NEURO: Moves all extremities equally, sensation to light touch intact diffusely, cranial nerves II through XII intact; strength 5/5 to all four extremities PSYCH: The patient's mood and manner are appropriate. Grooming and personal hygiene are appropriate.  MEDICAL DECISION MAKING:  Patient here with complaints of diffuse throbbing headache with photophobia and nausea. Reports this feels exactly like his prior migraines.  No neuro deficits, no fever.  Doubt ICH or infectious etiology.  Will treat symptomatically with Toradol, Reglan, Benadryl, IVF.  I do no feel he needs  emergent head imaging.  ED PROGRESS:  Patient's headache is now completely gone. Blood pressure in the 140s/90s. Have advised him to stop taking his family members blood pressure medication as I do not feel he needs it. We'll give him outpatient PCP follow-up information. Suspect that he was having a migraine headache. We'll discharge with prescription for Fioricet, Phenergan. Discussed return precautions. He has someone to drive him home. He verbalizes understanding and is comfortable with this plan.     I personally performed the services described in this documentation, which was scribed in my presence. The recorded information has been reviewed and is accurate.    Layla Maw Rebeckah Masih, DO 04/12/15 [phone]

## 2015-04-12 NOTE — ED Notes (Signed)
Pt ambulated to restroom. 

## 2015-04-12 NOTE — Discharge Instructions (Signed)

## 2015-04-12 NOTE — ED Notes (Signed)
Pt was called three times from triage with no answer and taken out of system, pt has now returned, pt placed back on the list.

## 2015-04-12 NOTE — ED Notes (Signed)
Pt states that this is his third time to the ED for hypertension; he states he has not followed up on his referrals from his other ED visits

## 2015-05-09 ENCOUNTER — Inpatient Hospital Stay: Payer: Self-pay | Admitting: Internal Medicine

## 2015-08-11 ENCOUNTER — Encounter (HOSPITAL_COMMUNITY): Payer: Self-pay | Admitting: Emergency Medicine

## 2015-08-11 ENCOUNTER — Emergency Department (HOSPITAL_COMMUNITY): Payer: No Typology Code available for payment source

## 2015-08-11 ENCOUNTER — Emergency Department (HOSPITAL_COMMUNITY)
Admission: EM | Admit: 2015-08-11 | Discharge: 2015-08-11 | Disposition: A | Discharge status: Home / Self Care | Payer: No Typology Code available for payment source | Attending: Emergency Medicine | Admitting: Emergency Medicine

## 2015-08-11 DIAGNOSIS — F17210 Nicotine dependence, cigarettes, uncomplicated: Secondary | ICD-10-CM | POA: Insufficient documentation

## 2015-08-11 DIAGNOSIS — S99912A Unspecified injury of left ankle, initial encounter: Secondary | ICD-10-CM | POA: Insufficient documentation

## 2015-08-11 DIAGNOSIS — Y9339 Activity, other involving climbing, rappelling and jumping off: Secondary | ICD-10-CM | POA: Insufficient documentation

## 2015-08-11 DIAGNOSIS — X509XXA Other and unspecified overexertion or strenuous movements or postures, initial encounter: Secondary | ICD-10-CM | POA: Insufficient documentation

## 2015-08-11 DIAGNOSIS — Y929 Unspecified place or not applicable: Secondary | ICD-10-CM | POA: Insufficient documentation

## 2015-08-11 DIAGNOSIS — S99921A Unspecified injury of right foot, initial encounter: Secondary | ICD-10-CM | POA: Insufficient documentation

## 2015-08-11 DIAGNOSIS — Y990 Civilian activity done for income or pay: Secondary | ICD-10-CM | POA: Insufficient documentation

## 2015-08-11 MED ORDER — IBUPROFEN 800 MG PO TABS: 800.0000 mg | ORAL_TABLET | Freq: Three times a day (TID) | ORAL | Status: DC | PRN
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
## 2015-08-11 NOTE — ED Notes (Signed)
PT LEFT WITHOUT DISCHARGE INSTRUCTIONS AND PRESCRIPTION.
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
## 2015-08-11 NOTE — Discharge Instructions (Signed)

## 2015-08-16 ENCOUNTER — Encounter (HOSPITAL_BASED_OUTPATIENT_CLINIC_OR_DEPARTMENT_OTHER): Payer: Self-pay | Admitting: Emergency Medicine

## 2016-08-15 IMAGING — CR DG CHEST 2V
2 series · 2 of 2 positions shown · non-contrast
Comparison: September 01, 2011

CLINICAL DATA: Chest pain, shortness of breath for 2 weeks.

EXAM:
CHEST  2 VIEW

[w chest pa]
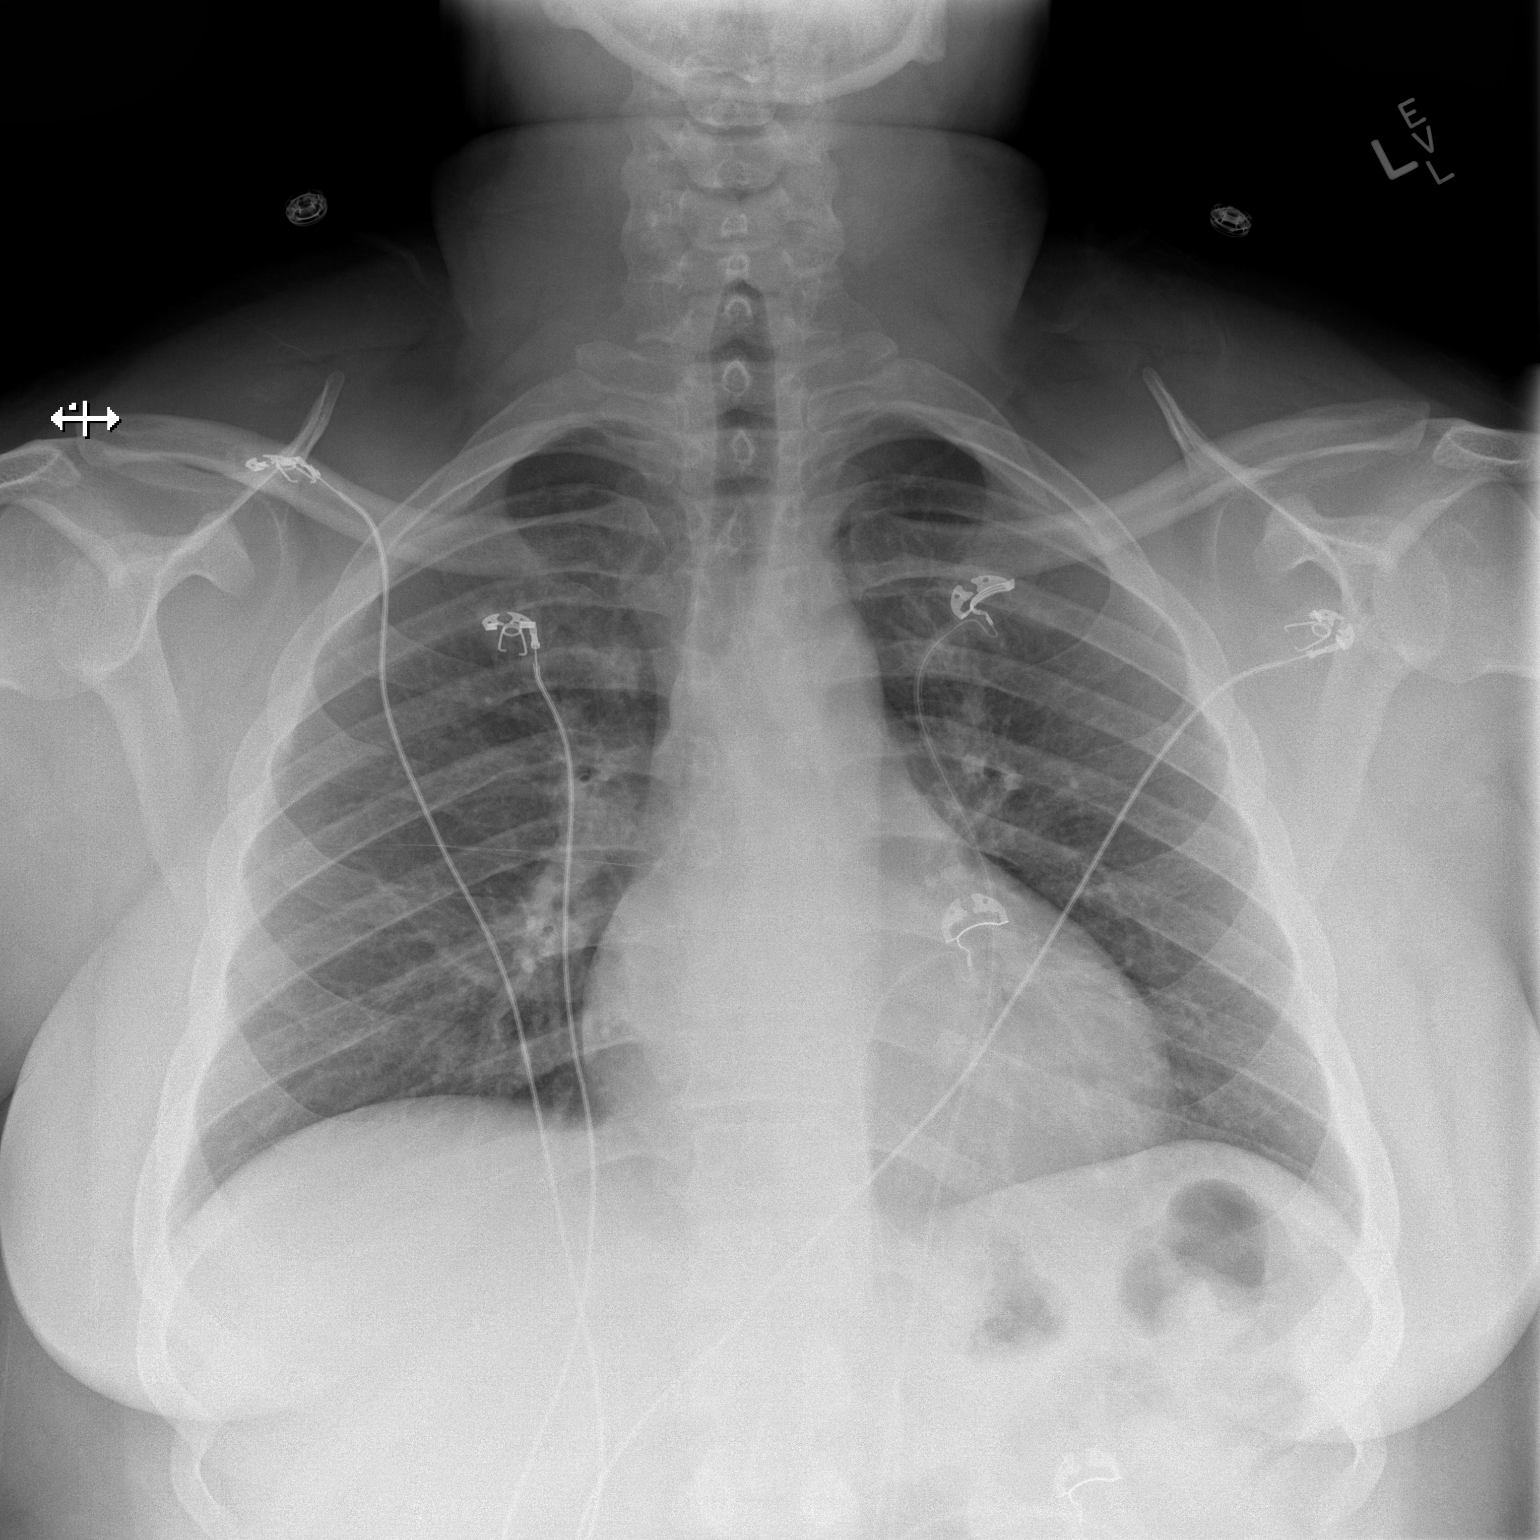

[w chest lat]
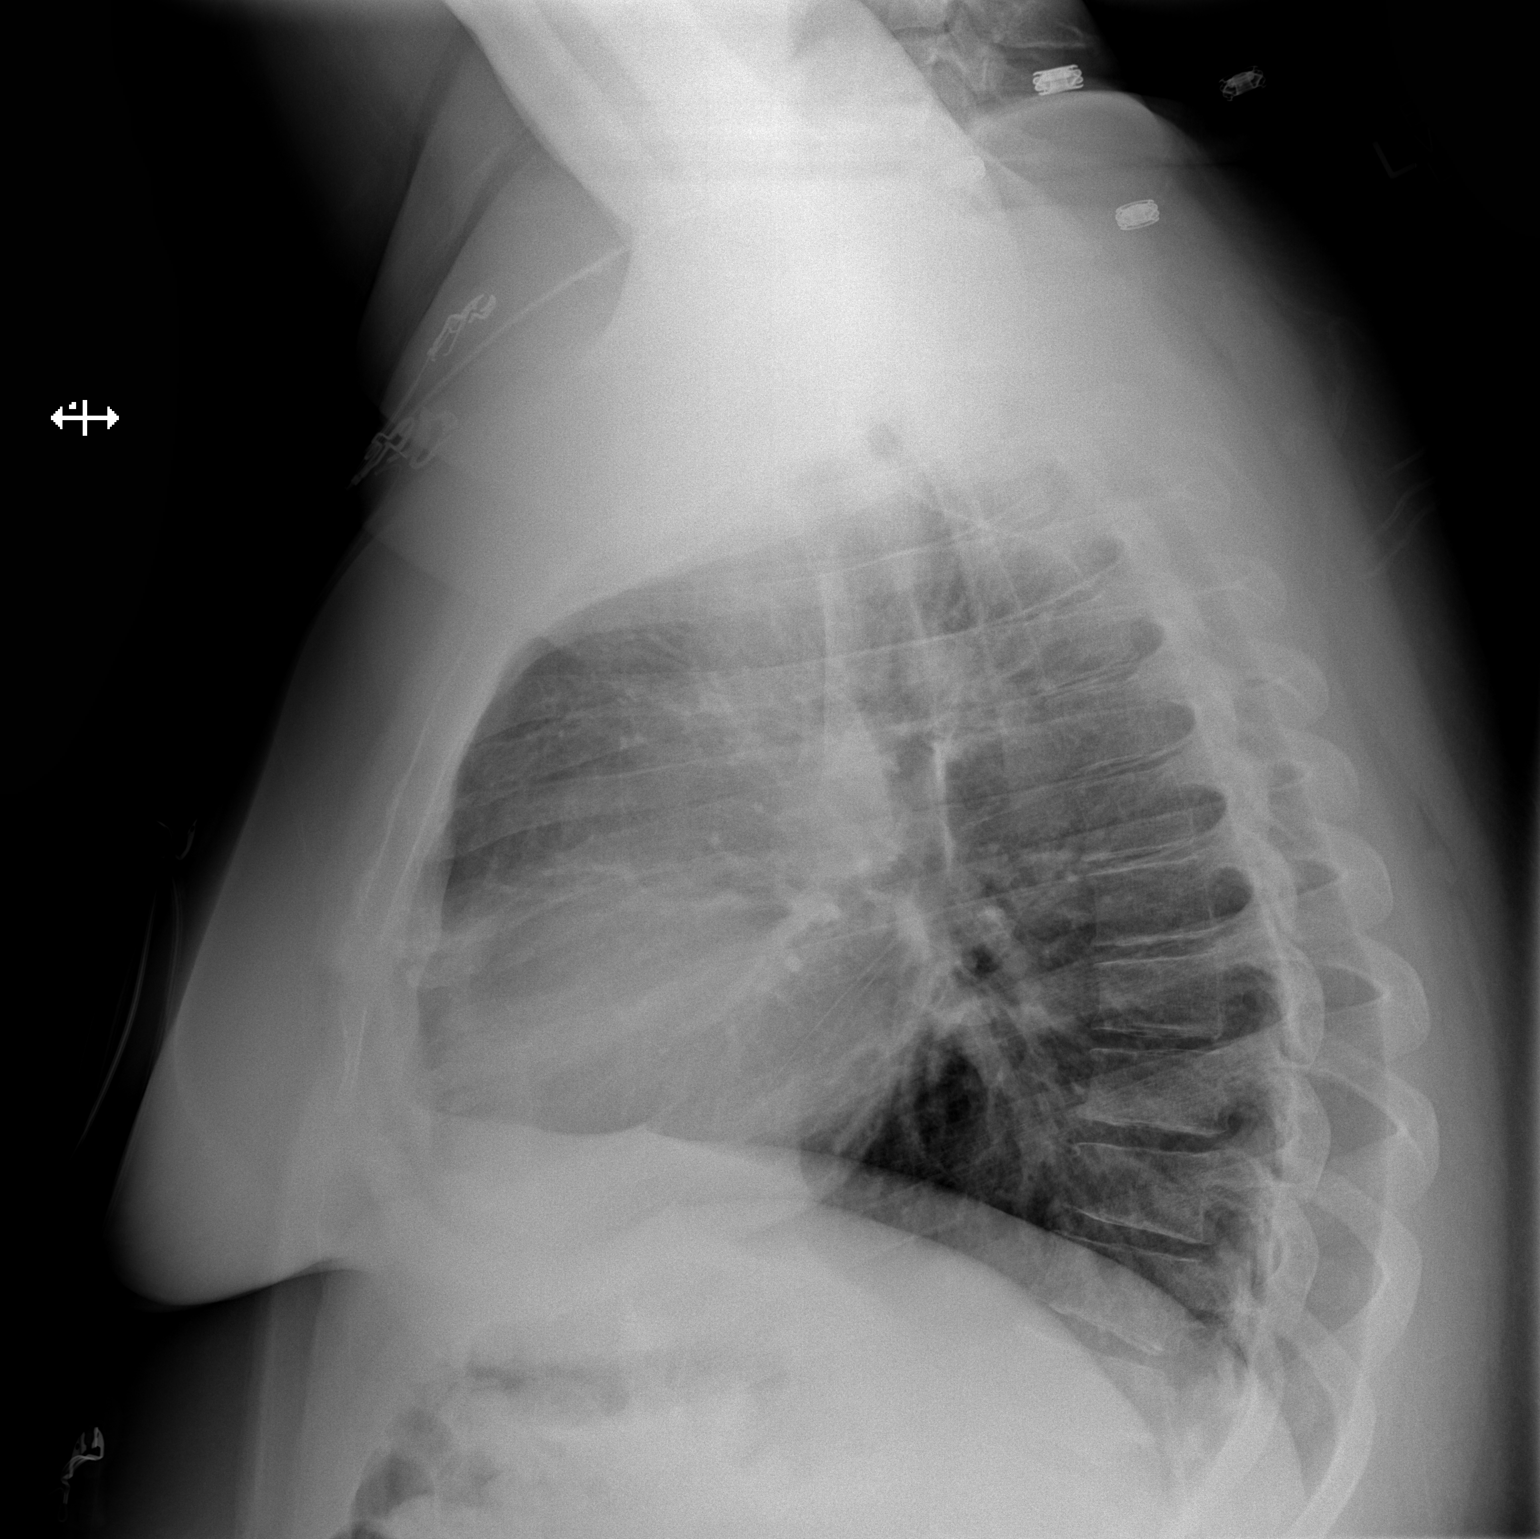

[2 of 2 positions shown; findings below may reference images not displayed]

FINDINGS: The heart size and mediastinal contours are within normal limits.
There is no focal infiltrate, pulmonary edema, or pleural effusion.
The visualized skeletal structures are unremarkable.
IMPRESSION: No active cardiopulmonary disease.

## 2016-08-28 ENCOUNTER — Ambulatory Visit: Payer: Self-pay | Admitting: Family Medicine

## 2016-09-19 ENCOUNTER — Encounter (HOSPITAL_COMMUNITY): Payer: Self-pay | Admitting: Emergency Medicine

## 2016-09-19 ENCOUNTER — Emergency Department (HOSPITAL_COMMUNITY)
Admission: EM | Admit: 2016-09-19 | Discharge: 2016-09-19 | Disposition: A | Discharge status: Home / Self Care | Payer: Self-pay | Attending: Emergency Medicine | Admitting: Emergency Medicine

## 2016-09-19 DIAGNOSIS — R509 Fever, unspecified: Secondary | ICD-10-CM

## 2016-09-19 DIAGNOSIS — K0889 Other specified disorders of teeth and supporting structures: Secondary | ICD-10-CM | POA: Insufficient documentation

## 2016-09-19 DIAGNOSIS — J029 Acute pharyngitis, unspecified: Secondary | ICD-10-CM | POA: Insufficient documentation

## 2016-09-19 DIAGNOSIS — F17210 Nicotine dependence, cigarettes, uncomplicated: Secondary | ICD-10-CM | POA: Insufficient documentation

## 2016-09-19 DIAGNOSIS — I10 Essential (primary) hypertension: Secondary | ICD-10-CM | POA: Insufficient documentation

## 2016-09-19 HISTORY — DX: Essential (primary) hypertension: I10

## 2016-09-19 LAB — RAPID STREP SCREEN (MED CTR MEBANE ONLY): STREPTOCOCCUS, GROUP A SCREEN (DIRECT): NEGATIVE

## 2016-09-19 MED ORDER — ACETAMINOPHEN 325 MG PO TABS
650.0000 mg | ORAL_TABLET | Freq: Once | ORAL | Status: AC
  Administered 2016-09-19: 650 mg via ORAL
  Filled 2016-09-19: qty 2

## 2016-09-19 MED ORDER — PENICILLIN V POTASSIUM 500 MG PO TABS: 500.0000 mg | ORAL_TABLET | Freq: Four times a day (QID) | ORAL | 0 refills | Status: AC

## 2016-09-19 MED ORDER — IBUPROFEN 800 MG PO TABS
800.0000 mg | ORAL_TABLET | Freq: Once | ORAL | Status: AC
  Administered 2016-09-19: 800 mg via ORAL
  Filled 2016-09-19: qty 1

## 2016-09-19 NOTE — Discharge Instructions (Signed)
Take penicillin as prescribed until all gone. Take ibuprofen 600mg  and tylenol 500mg  every 6 hrs for fever and body aches. Drink plenty of fluids. Salt water gargles for sore throat. Follow up with family doctor as needed. Return if worsening.

## 2016-09-19 NOTE — ED Provider Notes (Signed)
[facility]-EMERGENCY DEPT Provider Note   CSN: [id number] Arrival date & time: 09/19/16  1023  By signing my name below, I, Cynda AcresHailei Fulton, attest that this documentation has been prepared under the direction and in the presence of Melton Walls, PA-C. Electronically Signed: Cynda AcresHailei Fulton, Scribe. 09/19/16. 11:07 AM.  History   Chief Complaint Chief Complaint  Patient presents with  . Sore Throat    body aches   HPI Comments: Samuel Greer is a 26 y.o. male with no pertinent past medical history, who presents to the Emergency Department complaining of a persistent sore throat that began 2 days ago. Patient reports developing a gradually worsening sore throat for several days. Patient is able to tolerate both secretions and fluids. Patient denies any sick contacts. Patient reports associated neck pain, fever, cough, and resolved shortness of breath. No medications taken prior to arrival. Patient denies any nausea, vomiting, diarrhea, dysuria, or any additional symptoms.   The history is provided by the patient. No language interpreter was used.    Past Medical History:  Diagnosis Date  . Hypertension   . Migraine     There are no active problems to display for this patient.   Past Surgical History:  Procedure Laterality Date  . HIP SURGERY    . SHOULDER SURGERY     left       Home Medications    Prior to Admission medications   Medication Sig Start Date End Date Taking? Authorizing Provider  albuterol (PROVENTIL HFA;VENTOLIN HFA) 108 (90 Base) MCG/ACT inhaler Inhale 2 puffs into the lungs every 6 (six) hours as needed for wheezing or shortness of breath.    [provider]  ibuprofen (ADVIL,MOTRIN) 800 MG tablet Take 1 tablet (800 mg total) by mouth every 8 (eight) hours as needed for mild pain or moderate pain. 08/11/15   Trixie DredgeWest, Emily, PA-C  promethazine (PHENERGAN) 25 MG tablet Take 1 tablet (25 mg total) by mouth every 6 (six) hours as needed for nausea or  vomiting. 04/12/15   Ward, Layla MawKristen N, DO    Family History Family History  Problem Relation Age of Onset  . Cancer Other   . Diabetes Other     Social History Social History  Substance Use Topics  . Smoking status: Current Every Day Smoker    Packs/day: 0.50    Types: Cigarettes  . Smokeless tobacco: Not on file  . Alcohol use Yes     Comment: social     Allergies   Patient has no known allergies.   Review of Systems Review of Systems  Constitutional: Positive for fever. Negative for chills.  HENT: Positive for sore throat. Negative for ear pain and trouble swallowing.   Respiratory: Positive for cough.   Gastrointestinal: Negative for abdominal pain, diarrhea, nausea and vomiting.  Musculoskeletal: Positive for neck pain.     Physical Exam Updated Vital Signs BP (!) 151/104   Pulse (!) 111   Temp (!) 102.7 F (39.3 C) (Oral)   Resp 20   Ht 5\' 6"  (1.676 m)   Wt (!) 310 lb (140.6 kg)   SpO2 96%   BMI 50.04 kg/m   Physical Exam  Constitutional: He is oriented to person, place, and time. He appears well-developed.  HENT:  Head: Normocephalic and atraumatic.  Right Ear: External ear normal.  Left Ear: External ear normal.  Mouth/Throat: Oropharyngeal exudate present.  Tonsils bilaterally enlarged, erythematous, uvula midline. Left lower 3rd molar coming in, partially protruded through the skin with  mild gum swelling  Eyes: Pupils are equal, round, and reactive to light. Conjunctivae and EOM are normal.  Neck: Normal range of motion. Neck supple.  No meningismus  Cardiovascular: Regular rhythm and normal heart sounds.   tachycardic  Pulmonary/Chest: Effort normal and breath sounds normal. No respiratory distress. He has no wheezes. He has no rales.  Abdominal: Soft. Bowel sounds are normal.  Lymphadenopathy:    He has cervical adenopathy.  Neurological: He is alert and oriented to person, place, and time.  Skin: Skin is warm and dry.  Nursing note and  vitals reviewed.    ED Treatments / Results  DIAGNOSTIC STUDIES: Oxygen Saturation is 96% on RA, normal by my interpretation.    COORDINATION OF CARE: 10:59 AM Discussed treatment plan with pt at bedside and pt agreed to plan, which includes a strep test.   Labs (all labs ordered are listed, but only abnormal results are displayed) Labs Reviewed  RAPID STREP SCREEN (NOT AT [facility])    EKG  EKG Interpretation None       Radiology No results found.  Procedures Procedures (including critical care time)  Medications Ordered in ED Medications  acetaminophen (TYLENOL) tablet 650 mg (650 mg Oral Given 09/19/16 1051)     Initial Impression / Assessment and Plan / ED Course  I have reviewed the triage vital signs and the nursing notes.  Pertinent labs & imaging results that were available during my care of the patient were reviewed by me and considered in my medical decision making (see chart for details).     Patient emergency department with sore throat, fever, body aches. He is tachycardic, febrile at 102.7. No meningismus. No photophobia. Exam concerning for pharyngitis, possibly dental abscess. No evidence of peritonsillar abscess, uvula is midline. No respiratory difficulty,difficulty swallowing other than pain. Will give him some Tylenol, Motrin to lower the fever. We'll monitor. Rapid strep ordered.   1:08 PM  Patient's heart rate came down with ibuprofen and Tylenol. Fever is improving. His rapid strep is negative. Given recent dental pain, possible dental abscess and pharyngitis with exudate, will start on penicillin. It is however possible that his symptoms are caused by a viral pharyngitis , possibly mononucleosis, this was discussed with patient. He will need to follow-up closely with family doctor. Otherwise symptomatic treatment with Tylenol, Motrin, oral fluids, salt water gargles, strict return precautions discussed.  1:44 PM Temp down to 98.7. HR in 80s. BP  normal. Pt feels much better. Stable for dc home.   Vitals:   09/19/16 1230 09/19/16 1300 09/19/16 1315 09/19/16 1321  BP:    120/67  Pulse: 95 90 94 84  Resp:      Temp:      TempSrc:      SpO2: 100% 98% 96% 100%  Weight:      Height:         Final Clinical Impressions(s) / ED Diagnoses   Final diagnoses:  Acute pharyngitis, unspecified etiology  Pain, dental  Fever, unspecified fever cause    New Prescriptions New Prescriptions   No medications on file   I personally performed the services described in this documentation, which was scribed in my presence. The recorded information has been reviewed and is accurate.    Jaynie Crumble, PA-C 09/19/16 1344    Derwood Kaplan, MD 09/20/16 [phone]

## 2016-09-19 NOTE — ED Triage Notes (Signed)
Pt presents with multiple complaints, sts toothache and sore throat x 2 days , body aches, bil knee pain, shoulder and back pain. Presents with fever . Reports shortness of breath.  Alert and oriented x 4,  Hx HTN. No meds due to lack of insurance.

## 2016-09-21 LAB — CULTURE, GROUP A STREP (THRC)

## 2017-02-25 ENCOUNTER — Emergency Department (HOSPITAL_COMMUNITY)
Admission: EM | Admit: 2017-02-25 | Discharge: 2017-02-25 | Disposition: A | Discharge status: Home / Self Care | Payer: Self-pay | Attending: Emergency Medicine | Admitting: Emergency Medicine

## 2017-02-25 ENCOUNTER — Encounter (HOSPITAL_COMMUNITY): Payer: Self-pay | Admitting: Nurse Practitioner

## 2017-02-25 ENCOUNTER — Other Ambulatory Visit: Payer: Self-pay

## 2017-02-25 DIAGNOSIS — F17210 Nicotine dependence, cigarettes, uncomplicated: Secondary | ICD-10-CM | POA: Insufficient documentation

## 2017-02-25 DIAGNOSIS — M545 Low back pain, unspecified: Secondary | ICD-10-CM

## 2017-02-25 DIAGNOSIS — I10 Essential (primary) hypertension: Secondary | ICD-10-CM | POA: Insufficient documentation

## 2017-02-25 DIAGNOSIS — R300 Dysuria: Secondary | ICD-10-CM | POA: Insufficient documentation

## 2017-02-25 LAB — URINALYSIS, ROUTINE W REFLEX MICROSCOPIC
Bacteria, UA: NONE SEEN
Bilirubin Urine: NEGATIVE
GLUCOSE, UA: NEGATIVE mg/dL
Hgb urine dipstick: NEGATIVE
Ketones, ur: NEGATIVE mg/dL
Nitrite: NEGATIVE
Protein, ur: NEGATIVE mg/dL
SPECIFIC GRAVITY, URINE: 1.028 (ref 1.005–1.030)
Squamous Epithelial / LPF: NONE SEEN
pH: 5 (ref 5.0–8.0)

## 2017-02-25 MED ORDER — CYCLOBENZAPRINE HCL 10 MG PO TABS: 10.0000 mg | ORAL_TABLET | Freq: Two times a day (BID) | ORAL | 0 refills | Status: DC | PRN

## 2017-02-25 MED ORDER — LIDOCAINE HCL (PF) 1 % IJ SOLN
INTRAMUSCULAR | Status: AC
  Filled 2017-02-25: qty 5

## 2017-02-25 MED ORDER — PREDNISONE 20 MG PO TABS
60.0000 mg | ORAL_TABLET | Freq: Once | ORAL | Status: AC
  Administered 2017-02-25: 60 mg via ORAL
  Filled 2017-02-25: qty 3

## 2017-02-25 MED ORDER — CEFTRIAXONE SODIUM 250 MG IJ SOLR
250.0000 mg | Freq: Once | INTRAMUSCULAR | Status: AC
  Administered 2017-02-25: 250 mg via INTRAMUSCULAR
  Filled 2017-02-25: qty 250

## 2017-02-25 MED ORDER — OXYCODONE-ACETAMINOPHEN 5-325 MG PO TABS
1.0000 | ORAL_TABLET | Freq: Once | ORAL | Status: AC
Start: 2017-02-25 — End: 2017-02-25
  Administered 2017-02-25: 1 via ORAL
  Filled 2017-02-25: qty 1

## 2017-02-25 MED ORDER — AZITHROMYCIN 250 MG PO TABS
1000.0000 mg | ORAL_TABLET | Freq: Once | ORAL | Status: AC
  Administered 2017-02-25: 1000 mg via ORAL
  Filled 2017-02-25: qty 4

## 2017-02-25 MED ORDER — DOXYCYCLINE HYCLATE 100 MG PO CAPS: 100.0000 mg | ORAL_CAPSULE | Freq: Two times a day (BID) | ORAL | 0 refills | Status: AC

## 2017-02-25 MED ORDER — CYCLOBENZAPRINE HCL 10 MG PO TABS
10.0000 mg | ORAL_TABLET | Freq: Once | ORAL | Status: AC
  Administered 2017-02-25: 10 mg via ORAL
  Filled 2017-02-25: qty 1

## 2017-02-25 MED ORDER — PREDNISONE 10 MG PO TABS: 40.0000 mg | ORAL_TABLET | Freq: Every day | ORAL | 0 refills | Status: AC

## 2017-02-25 NOTE — Discharge Instructions (Signed)
Please take all of your antibiotics until finished!   You may develop abdominal discomfort or diarrhea from the antibiotic.  You may help offset this with probiotics which you can buy or get in yogurt. Do not eat  or take the probiotics until 2 hours after your antibiotic.   Your urine has been sent off for culture.  If your urine culture something that you are not being treated with you will be informed.  Follow-up at the health department for further STD testing.  Start taking prednisone beginning tomorrow for your back pain.  You received the first dose in the emergency department today.  Do not take ibuprofen, Motrin, Aleve, or Naprosyn while on this medication, however you may take [phone] mg of Tylenol every 6 hours additionally for pain.  Do not exceed 4000 milligrams of Tylenol daily. You may take Flexeril up to twice daily as needed for muscle spasms. This medication may make you drowsy, so I typically only recommended at night. If this medication makes you drowsy throughout the day, no driving, drinking alcohol, or operating heavy machinery. You may also cut these tablets in half. Apply ice or heat to the back for comfort.  Do some gentle stretching throughout the day, especially during hot showers or baths. Take short frequent walks and avoid prolonged periods of sitting or laying. Expect to be sore for the next few day and follow up with primary care physician for recheck of ongoing symptoms but return to ER for emergent changing or worsening of symptoms such as severe numbness to the arms or legs,loss of control of bowel or bladder, fevers, severe scrotal pain or swelling.

## 2017-02-25 NOTE — ED Provider Notes (Signed)
[facility]   CSN: [id number] Arrival date & time: 02/25/17  2200     History   Chief Complaint Chief Complaint  Patient presents with  . Back Pain  . Urinary Frequency    HPI Samuel Greer is a 26 y.o. male with history of hypertension and migraines presents today with chief complaint acute onset, progressively worsening low back pain for 3 days as well as dysuria which began earlier today.  He states that pain in the low back is constant, primarily right-sided with radiation into the buttocks.  He denies numbness, tingling, weakness, fevers, chills, bowel or bladder incontinence, saddle anesthesia, IV drug use, or prior history of cancer.  He states that he typically gets this back pain yearly during the winter.  He denies any recent trauma, falls, or bending, lifting, or twisting injury.  Pain worsens with bending and changing positions, and almost entirely resolves when he lays flat on his back on the floor.  He has tried ibuprofen and Tylenol without significant relief of his symptoms.  He notes dysuria which began earlier today.  He is sexually active with females and does not always use protection.  He denies frequency, urgency, hematuria, melena, hematochezia, pain with bowel movements or rectal pain, testicular pain or swelling, penile pain or swelling, or penile drainage.  He has tested positive for chlamydia in the past.  He has not tried anything for the symptoms. The history is provided by the patient.    Past Medical History:  Diagnosis Date  . Hypertension   . Migraine     There are no active problems to display for this patient.   Past Surgical History:  Procedure Laterality Date  . HIP SURGERY    . SHOULDER SURGERY     left       Home Medications    Prior to Admission medications   Medication Sig Start Date End Date Taking? Authorizing Provider  albuterol (PROVENTIL HFA;VENTOLIN HFA) 108 (90 Base)  MCG/ACT inhaler Inhale 2 puffs into the lungs every 6 (six) hours as needed for wheezing or shortness of breath.    [provider]  cyclobenzaprine (FLEXERIL) 10 MG tablet Take 1 tablet (10 mg total) by mouth 2 (two) times daily as needed for muscle spasms. 02/25/17   Luevenia MaxinFawze, Stavroula Rohde A, PA-C  doxycycline (VIBRAMYCIN) 100 MG capsule Take 1 capsule (100 mg total) by mouth 2 (two) times daily for 10 days. 02/25/17 03/07/17  Michela PitcherFawze, Salena Ortlieb A, PA-C  ibuprofen (ADVIL,MOTRIN) 800 MG tablet Take 1 tablet (800 mg total) by mouth every 8 (eight) hours as needed for mild pain or moderate pain. 08/11/15   Trixie DredgeWest, Emily, PA-C  predniSONE (DELTASONE) 10 MG tablet Take 4 tablets (40 mg total) by mouth daily with breakfast for 5 days. 02/25/17 03/02/17  Michela PitcherFawze, Panayiotis Rainville A, PA-C  promethazine (PHENERGAN) 25 MG tablet Take 1 tablet (25 mg total) by mouth every 6 (six) hours as needed for nausea or vomiting. 04/12/15   Ward, Layla MawKristen N, DO    Family History Family History  Problem Relation Age of Onset  . Cancer Other   . Diabetes Other     Social History Social History   Tobacco Use  . Smoking status: Current Every Day Smoker    Packs/day: 0.50    Types: Cigarettes  . Smokeless tobacco: Never Used  Substance Use Topics  . Alcohol use: Yes    Comment: social  . Drug use: No  Allergies   Patient has no known allergies.   Review of Systems Review of Systems  Constitutional: Negative for chills and fever.  Respiratory: Negative for shortness of breath.   Cardiovascular: Negative for chest pain.  Gastrointestinal: Negative for abdominal pain, anal bleeding, blood in stool, constipation, diarrhea, nausea, rectal pain and vomiting.  Genitourinary: Positive for dysuria. Negative for decreased urine volume, difficulty urinating, discharge, flank pain, genital sores, hematuria, penile pain, penile swelling, scrotal swelling, testicular pain and urgency.  Musculoskeletal: Positive for back pain.  Neurological:  Negative for syncope, weakness and numbness.  All other systems reviewed and are negative.    Physical Exam Updated Vital Signs BP 139/88 (BP Location: Right Arm)   Pulse (!) 103   Temp 99 F (37.2 C) (Oral)   Resp 18   Ht 5' 5.5" (1.664 m)   Wt (!) 139.7 kg (308 lb)   SpO2 98%   BMI 50.47 kg/m   Physical Exam  Constitutional: He is oriented to person, place, and time. He appears well-developed and well-nourished. No distress.  HENT:  Head: Normocephalic and atraumatic.  Eyes: Conjunctivae are normal. Right eye exhibits no discharge. Left eye exhibits no discharge.  Neck: Normal range of motion. Neck supple. No JVD present. No tracheal deviation present.  Cardiovascular: Normal rate, regular rhythm, normal heart sounds and intact distal pulses.  2+ radial and DP/PT pulses bl, negative Homan's bl, no LE edema  Pulmonary/Chest: Effort normal and breath sounds normal. He exhibits no tenderness.  Abdominal: Soft. Bowel sounds are normal. He exhibits no distension. There is no guarding.  Murphy sign absent, Rovsing's absent, no TTP at McBurney's point, no CVA tenderness  Genitourinary:  Genitourinary Comments: Examination performed in the presence of a chaperone.  Patient is uncircumcised.  No inguinal lymphadenopathy.  No genital lesions or masses noted.  He has very mild testicular pain posteriorly of the right testicle. Cremasteric reflex present bilaterally. No scrotal erythema, edema, or tenderness. No penile erythema, swelling, or tenderness  Musculoskeletal: He exhibits tenderness. He exhibits no edema.  No midline spine TTP, right paraspinal muscle tenderness noted at around L3-S1, no deformity, crepitus, or step-off noted.  Pain is exacerbated with forward flexion of the lumbar spine as well as lateral rotation to the left and standing from a seated position.  No pain with extension of the lumbar spine positive straight leg raise noted bilaterally.  5/5 strength of BLE major  muscle groups.  Examination of the joints of the bilateral lower extremities is unremarkable.   Neurological: He is alert and oriented to person, place, and time. No cranial nerve deficit or sensory deficit. He exhibits normal muscle tone.  Fluent speech, no facial droop, sensation intact to soft touch of bilateral lower extremity, antalgic gait, but patient able to heel walk and toe walk without difficulty.   Skin: Skin is warm and dry. No erythema.  Psychiatric: He has a normal mood and affect. His behavior is normal.  Nursing note and vitals reviewed.    ED Treatments / Results  Labs (all labs ordered are listed, but only abnormal results are displayed) Labs Reviewed  URINALYSIS, ROUTINE W REFLEX MICROSCOPIC - Abnormal; Notable for the following components:      Result Value   Leukocytes, UA MODERATE (*)    All other components within normal limits  URINE CULTURE  GC/CHLAMYDIA PROBE AMP ([place]) NOT AT [facility]    EKG  EKG Interpretation None       Radiology No results found.  Procedures Procedures (including critical care time)  Medications Ordered in ED Medications  lidocaine (PF) (XYLOCAINE) 1 % injection (not administered)  cefTRIAXone (ROCEPHIN) injection 250 mg (250 mg Intramuscular Given 02/25/17 2309)  azithromycin (ZITHROMAX) tablet 1,000 mg (1,000 mg Oral Given 02/25/17 2312)  predniSONE (DELTASONE) tablet 60 mg (60 mg Oral Given 02/25/17 2312)  cyclobenzaprine (FLEXERIL) tablet 10 mg (10 mg Oral Given 02/25/17 2312)  oxyCODONE-acetaminophen (PERCOCET/ROXICET) 5-325 MG per tablet 1 tablet (1 tablet Oral Given 02/25/17 2312)     Initial Impression / Assessment and Plan / ED Course  I have reviewed the triage vital signs and the nursing notes.  Pertinent labs & imaging results that were available during my care of the patient were reviewed by me and considered in my medical decision making (see chart for details).     Patient with right-sided low back  pain for 3 days and dysuria for 1 day.  He is nontoxic in appearance and he is afebrile and normotensive.  He is mildly tachycardic but states he has not been drinking as much water over the past several days and thinks he may be dehydrated.  No red flag signs concerning for cauda equina or spinal abscess.  Doubt aortic dissection.  No focal neurological deficits.  Pain is reproducible on palpation and with movement.  It appears to be musculoskeletal in nature.  He states he has this pain on a yearly basis during the wintertime and this feels like his usual back pain.  Will treat with steroids and muscle relaxers as well as Tylenol, ice, heat, and gentle stretching.  Recommend follow-up with primary care physician or orthopedic physician for reevaluation of his back pain which appears to be acute on chronic in nature.  He is ambulatory although it is painful.  Pain managed while in the ED today.  UA shows too numerous to count white blood cells.  He has very mild tenderness to palpation of the right testicle posteriorly, suggesting epididymitis.  I highly doubt torsion given patient has no pain at rest and tenderness to palpation is very mild.  Cremasteric reflex is also intact.  Physical examination is not consistent with Fournier's gangrene.  Doubt proctitis, prostatitis, or syphilis. GC chlamydia cultures pending, will treat empirically with azithromycin, Rocephin, and 10-day course of doxycycline twice daily for presumed epididymitis.  He is aware that results are pending and he will be informed if they are abnormal.  He will follow-up with his primary care physician for reevaluation of symptoms.   Discussed indications for return to the ED. Pt verbalized understanding of and agreement with plan and is safe for discharge home at this time.  He has no complaints prior to discharge.  Final Clinical Impressions(s) / ED Diagnoses   Final diagnoses:  Acute right-sided low back pain without sciatica  Dysuria     ED Discharge Orders        Ordered    cyclobenzaprine (FLEXERIL) 10 MG tablet  2 times daily PRN     02/25/17 2312    predniSONE (DELTASONE) 10 MG tablet  Daily with breakfast     02/25/17 2312    doxycycline (VIBRAMYCIN) 100 MG capsule  2 times daily     02/25/17 2312       Jeanie SewerFawze, Maliyah Willets A, PA-C 02/25/17 2353    Charlynne PanderYao, David Hsienta, MD 02/28/17 [phone]

## 2017-02-25 NOTE — ED Triage Notes (Addendum)
Pt endorses lower back pain x2 days and painful urination x 1 day and urinary urgency. Pt denies penile drainage. Endorses unprotected sex. Denies paresthesias, or bowel/bladder incontinence. Pt sts back pain worsens with leg extension. Pt sts took ibuprofen with no relief of symptoms. Pt sts pain decreases when laying down. Denies fevers and chills.

## 2017-02-27 LAB — GC/CHLAMYDIA PROBE AMP (~~LOC~~) NOT AT ARMC
Chlamydia: POSITIVE — AB
Neisseria Gonorrhea: NEGATIVE

## 2017-06-06 ENCOUNTER — Other Ambulatory Visit: Payer: Self-pay

## 2017-06-06 ENCOUNTER — Emergency Department (HOSPITAL_COMMUNITY): Admission: EM | Admit: 2017-06-06 | Discharge: 2017-06-06 | Discharge status: Against Medical Advice | Payer: Self-pay

## 2017-06-06 ENCOUNTER — Emergency Department (HOSPITAL_COMMUNITY)
Admission: EM | Admit: 2017-06-06 | Discharge: 2017-06-06 | Disposition: A | Discharge status: Home / Self Care | Payer: Self-pay | Attending: Emergency Medicine | Admitting: Emergency Medicine

## 2017-06-06 ENCOUNTER — Encounter (HOSPITAL_COMMUNITY): Payer: Self-pay | Admitting: Emergency Medicine

## 2017-06-06 DIAGNOSIS — Z7689 Persons encountering health services in other specified circumstances: Secondary | ICD-10-CM

## 2017-06-06 DIAGNOSIS — Z202 Contact with and (suspected) exposure to infections with a predominantly sexual mode of transmission: Secondary | ICD-10-CM | POA: Insufficient documentation

## 2017-06-06 DIAGNOSIS — F17210 Nicotine dependence, cigarettes, uncomplicated: Secondary | ICD-10-CM | POA: Insufficient documentation

## 2017-06-06 DIAGNOSIS — I10 Essential (primary) hypertension: Secondary | ICD-10-CM | POA: Insufficient documentation

## 2017-06-06 MED ORDER — CEFTRIAXONE SODIUM 250 MG IJ SOLR
250.0000 mg | Freq: Once | INTRAMUSCULAR | Status: AC
  Administered 2017-06-06: 250 mg via INTRAMUSCULAR
  Filled 2017-06-06: qty 250

## 2017-06-06 MED ORDER — METRONIDAZOLE 500 MG PO TABS
2000.0000 mg | ORAL_TABLET | Freq: Once | ORAL | Status: AC
  Administered 2017-06-06: 2000 mg via ORAL
  Filled 2017-06-06: qty 4

## 2017-06-06 MED ORDER — STERILE WATER FOR INJECTION IJ SOLN
INTRAMUSCULAR | Status: AC
  Administered 2017-06-06: 10 mL
  Filled 2017-06-06: qty 10

## 2017-06-06 MED ORDER — AZITHROMYCIN 250 MG PO TABS
1000.0000 mg | ORAL_TABLET | Freq: Once | ORAL | Status: AC
  Administered 2017-06-06: 1000 mg via ORAL
  Filled 2017-06-06: qty 4

## 2017-06-06 NOTE — ED Triage Notes (Signed)
Pt arriving POV requesting and STD check. Pt states his sexual partner informed him she was diagnosed with trich.

## 2017-06-06 NOTE — ED Notes (Signed)
Pt left AMA °

## 2017-06-06 NOTE — ED Provider Notes (Signed)
[facility]-EMERGENCY DEPT Provider Note   CSN: [id number] Arrival date & time: 06/06/17  0545     History   Chief Complaint Chief Complaint  Patient presents with  . STD check    HPI Samuel Greer is a 27 y.o. male.  HPI Patient presents to the emergency department with concerns about sexually transmitted diseases.  The patient states that his partner called him and states she had trichomonas.  He states he does not currently have any symptoms.  He denies any dysuria, penile discharge, testicle pain, abdominal pain, nausea, vomiting or diarrhea. Past Medical History:  Diagnosis Date  . Hypertension   . Migraine     There are no active problems to display for this patient.   Past Surgical History:  Procedure Laterality Date  . HIP SURGERY    . SHOULDER SURGERY     left        Home Medications    Prior to Admission medications   Medication Sig Start Date End Date Taking? Authorizing Provider  albuterol (PROVENTIL HFA;VENTOLIN HFA) 108 (90 Base) MCG/ACT inhaler Inhale 2 puffs into the lungs every 6 (six) hours as needed for wheezing or shortness of breath.    [provider]  cyclobenzaprine (FLEXERIL) 10 MG tablet Take 1 tablet (10 mg total) by mouth 2 (two) times daily as needed for muscle spasms. 02/25/17   Fawze, Mina A, PA-C  ibuprofen (ADVIL,MOTRIN) 800 MG tablet Take 1 tablet (800 mg total) by mouth every 8 (eight) hours as needed for mild pain or moderate pain. 08/11/15   Trixie Dredge, PA-C  promethazine (PHENERGAN) 25 MG tablet Take 1 tablet (25 mg total) by mouth every 6 (six) hours as needed for nausea or vomiting. 04/12/15   Ward, Layla Maw, DO    Family History Family History  Problem Relation Age of Onset  . Cancer Other   . Diabetes Other     Social History Social History   Tobacco Use  . Smoking status: Current Every Day Smoker    Packs/day: 0.50    Types: Cigarettes  . Smokeless tobacco: Never Used  Substance Use  Topics  . Alcohol use: Yes    Comment: social  . Drug use: No     Allergies   Patient has no known allergies.   Review of Systems Review of Systems All other systems negative except as documented in the HPI. All pertinent positives and negatives as reviewed in the HPI. Physical Exam Updated Vital Signs BP 137/80 (BP Location: Left Arm)   Pulse 68   Temp 97.6 F (36.4 C) (Oral)   Resp 18   SpO2 98%   Physical Exam  Constitutional: He is oriented to person, place, and time. He appears well-developed and well-nourished. No distress.  HENT:  Head: Normocephalic and atraumatic.  Eyes: Pupils are equal, round, and reactive to light.  Pulmonary/Chest: Effort normal.  Neurological: He is alert and oriented to person, place, and time.  Skin: Skin is warm and dry.  Psychiatric: He has a normal mood and affect.  Nursing note and vitals reviewed.    ED Treatments / Results  Labs (all labs ordered are listed, but only abnormal results are displayed) Labs Reviewed  GC/CHLAMYDIA PROBE AMP ([place]) NOT AT [facility]    EKG None  Radiology No results found.  Procedures Procedures (including critical care time)  Medications Ordered in ED Medications  metroNIDAZOLE (FLAGYL) tablet 2,000 mg (has no administration in time range)  cefTRIAXone (  ROCEPHIN) injection 250 mg (has no administration in time range)  azithromycin (ZITHROMAX) tablet 1,000 mg (has no administration in time range)     Initial Impression / Assessment and Plan / ED Course  I have reviewed the triage vital signs and the nursing notes.  Pertinent labs & imaging results that were available during my care of the patient were reviewed by me and considered in my medical decision making (see chart for details).     To the fact that the patient has having no symptoms exam was not performed and the patient would rather be treated for all STDs at this point.  Patient is advised the plan and all questions were  answered patient agrees. Final Clinical Impressions(s) / ED Diagnoses   Final diagnoses:  None    ED Discharge Orders    None       Charlestine NightLawyer, Devante Capano, PA-C 06/06/17 0715    Dione BoozeGlick, David, MD 06/06/17 [phone]

## 2017-06-06 NOTE — ED Notes (Signed)
Bed: [facility]3 Expected date:  Expected time:  Means of arrival:  Comments: 

## 2017-06-06 NOTE — ED Notes (Signed)
Pt called from the lobby with no response x2 

## 2017-06-06 NOTE — Discharge Instructions (Addendum)
Return here as needed. Follow up with a primary doctor. °

## 2017-06-07 LAB — GC/CHLAMYDIA PROBE AMP (~~LOC~~) NOT AT ARMC
Chlamydia: NEGATIVE
Neisseria Gonorrhea: NEGATIVE

## 2017-08-20 ENCOUNTER — Encounter (HOSPITAL_COMMUNITY): Payer: Self-pay | Admitting: Emergency Medicine

## 2017-08-20 ENCOUNTER — Other Ambulatory Visit: Payer: Self-pay

## 2017-08-20 ENCOUNTER — Emergency Department (HOSPITAL_COMMUNITY)
Admission: EM | Admit: 2017-08-20 | Discharge: 2017-08-20 | Disposition: A | Discharge status: Home / Self Care | Payer: Self-pay | Attending: Emergency Medicine | Admitting: Emergency Medicine

## 2017-08-20 DIAGNOSIS — I10 Essential (primary) hypertension: Secondary | ICD-10-CM | POA: Insufficient documentation

## 2017-08-20 DIAGNOSIS — Z79899 Other long term (current) drug therapy: Secondary | ICD-10-CM | POA: Insufficient documentation

## 2017-08-20 DIAGNOSIS — F17210 Nicotine dependence, cigarettes, uncomplicated: Secondary | ICD-10-CM | POA: Insufficient documentation

## 2017-08-20 DIAGNOSIS — L245 Irritant contact dermatitis due to other chemical products: Secondary | ICD-10-CM

## 2017-08-20 NOTE — Discharge Instructions (Addendum)
Please use over-the-counter triple antibiotic ointment, such as Neosporin, to prevent infection.   If symptoms do not improve or get worse this may be a sign of a bacterial infection or possibly a fungal infection and you need to return to the emergency department for treatment as soon as possible. Please keep the area as dry and clean as possible.  Make sure that you change your underwear often.  Contact a doctor if: You do not get better with treatment. Your condition gets worse. You have signs of infection such as: Swelling. Tenderness. Redness. Soreness. Warmth. You have a fever. You have new symptoms. Get help right away if: You have a very bad headache. You have neck pain. Your neck is stiff. You throw up (vomit). You feel very sleepy. You see red streaks coming from the affected area. Your bone or joint underneath the affected area becomes painful after the skin has healed. The affected area turns darker. You have trouble breathing.

## 2017-08-20 NOTE — ED Triage Notes (Signed)
Pt states he has been having red pinpoint rash over groins/genitalia. Pt states it itches. Denies drainage. Pt concerned he may have lice in his genitalia- so he used Darene Lamerair- and now it feels like he is burning. Pt recently treated for Trichomonas. Denies seeing any "bugs" in his genitalia.
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
## 2017-08-20 NOTE — ED Notes (Signed)
Pt alert and oriented in NAD. Pt verbalized understanding of discharge instructions. 

## 2017-12-09 ENCOUNTER — Emergency Department (HOSPITAL_COMMUNITY)
Admission: EM | Admit: 2017-12-09 | Discharge: 2017-12-09 | Disposition: A | Discharge status: Home / Self Care | Payer: Self-pay | Attending: Emergency Medicine | Admitting: Emergency Medicine

## 2017-12-09 ENCOUNTER — Encounter: Payer: Self-pay | Admitting: Pediatric Intensive Care

## 2017-12-09 ENCOUNTER — Other Ambulatory Visit: Payer: Self-pay

## 2017-12-09 ENCOUNTER — Encounter (HOSPITAL_COMMUNITY): Payer: Self-pay | Admitting: *Deleted

## 2017-12-09 DIAGNOSIS — F17210 Nicotine dependence, cigarettes, uncomplicated: Secondary | ICD-10-CM | POA: Insufficient documentation

## 2017-12-09 DIAGNOSIS — H6692 Otitis media, unspecified, left ear: Secondary | ICD-10-CM | POA: Insufficient documentation

## 2017-12-09 DIAGNOSIS — I10 Essential (primary) hypertension: Secondary | ICD-10-CM | POA: Insufficient documentation

## 2017-12-09 DIAGNOSIS — H6690 Otitis media, unspecified, unspecified ear: Secondary | ICD-10-CM

## 2017-12-09 MED ORDER — AMOXICILLIN 500 MG PO CAPS: 500.0000 mg | ORAL_CAPSULE | Freq: Three times a day (TID) | ORAL | 0 refills | Status: DC

## 2017-12-09 MED FILL — AMOXICILLIN 500 MG CAPSULE: 500 | 7 days supply | Qty: 21 | Fill #0

## 2017-12-09 NOTE — ED Provider Notes (Signed)
[facility]-EMERGENCY DEPT Provider Note   CSN: [id number] Arrival date & time: 12/09/17  4098     History   Chief Complaint Chief Complaint  Patient presents with  . Otalgia    left ear  . Exposure to STD    HPI Samuel Greer is a 27 y.o. male.  HPI Patient is a 27 year old male who presents to the emergency department with ongoing left ear pain since 2 AM.  No dental pain.  No fevers or chills.  No cough or congestion.  Worsening pain throughout the night.  Pain is moderate in severity.  No other complaints.   Past Medical History:  Diagnosis Date  . Hypertension   . Migraine     There are no active problems to display for this patient.   Past Surgical History:  Procedure Laterality Date  . HIP SURGERY    . SHOULDER SURGERY     left        Home Medications    Prior to Admission medications   Medication Sig Start Date End Date Taking? Authorizing Provider  albuterol (PROVENTIL HFA;VENTOLIN HFA) 108 (90 Base) MCG/ACT inhaler Inhale 2 puffs into the lungs every 6 (six) hours as needed for wheezing or shortness of breath.    [provider]  amoxicillin (AMOXIL) 500 MG capsule Take 1 capsule (500 mg total) by mouth 3 (three) times daily. 12/09/17   Azalia Bilis, MD  cyclobenzaprine (FLEXERIL) 10 MG tablet Take 1 tablet (10 mg total) by mouth 2 (two) times daily as needed for muscle spasms. 02/25/17   Fawze, Mina A, PA-C  ibuprofen (ADVIL,MOTRIN) 800 MG tablet Take 1 tablet (800 mg total) by mouth every 8 (eight) hours as needed for mild pain or moderate pain. 08/11/15   Trixie Dredge, PA-C  promethazine (PHENERGAN) 25 MG tablet Take 1 tablet (25 mg total) by mouth every 6 (six) hours as needed for nausea or vomiting. 04/12/15   Ward, Layla Maw, DO    Family History Family History  Problem Relation Age of Onset  . Cancer Other   . Diabetes Other     Social History Social History   Tobacco Use  . Smoking status: Current Every Day  Smoker    Packs/day: 0.50    Types: Cigarettes  . Smokeless tobacco: Never Used  Substance Use Topics  . Alcohol use: Yes    Comment: social  . Drug use: No     Allergies   Patient has no known allergies.   Review of Systems Review of Systems  All other systems reviewed and are negative.    Physical Exam Updated Vital Signs BP (!) 168/104 (BP Location: Right Arm)   Pulse 66   Temp 98.4 F (36.9 C) (Oral)   Resp 18   SpO2 95%   Physical Exam  Constitutional: He is oriented to person, place, and time. He appears well-developed and well-nourished.  HENT:  Head: Normocephalic.  Right TM with erythema and mild bulging.  No drainage.  External right ear is normal.  Eyes: EOM are normal.  Neck: Normal range of motion.  Pulmonary/Chest: Effort normal.  Abdominal: He exhibits no distension.  Musculoskeletal: Normal range of motion.  Neurological: He is alert and oriented to person, place, and time.  Psychiatric: He has a normal mood and affect.  Nursing note and vitals reviewed.    ED Treatments / Results  Labs (all labs ordered are listed, but only abnormal results are displayed) Labs Reviewed - No  data to display  EKG None  Radiology No results found.  Procedures Procedures (including critical care time)  Medications Ordered in ED Medications - No data to display   Initial Impression / Assessment and Plan / ED Course  I have reviewed the triage vital signs and the nursing notes.  Pertinent labs & imaging results that were available during my care of the patient were reviewed by me and considered in my medical decision making (see chart for details).     Home with antibiotics for otitis media.  STD screening referred to the health department.  Final Clinical Impressions(s) / ED Diagnoses   Final diagnoses:  Acute otitis media, unspecified otitis media type    ED Discharge Orders         Ordered    amoxicillin (AMOXIL) 500 MG capsule  3 times  daily     12/09/17 0739           Azalia Bilis, MD 12/09/17 [phone]

## 2017-12-09 NOTE — ED Notes (Signed)
Pt c/o left ear pain since yesterday, sts unable to sleep since 2 am today, took some aspirin without relief. Pain is throbbing, radiating to neck and head. Pt would also like to have STD screening done, no known exposure, has a partner, sts trying to establish routine checks.

## 2017-12-31 NOTE — Congregational Nurse Program (Signed)
Client was evaluated in ED for ear infection. Needs prescription filled. CN will fill at Outpatient pharmacy. Client to pick up this afternoon at 1200.

## 2018-11-10 ENCOUNTER — Other Ambulatory Visit: Payer: Self-pay

## 2018-11-10 ENCOUNTER — Encounter (HOSPITAL_COMMUNITY): Payer: Self-pay | Admitting: Emergency Medicine

## 2018-11-10 ENCOUNTER — Emergency Department (HOSPITAL_COMMUNITY)
Admission: EM | Admit: 2018-11-10 | Discharge: 2018-11-10 | Disposition: A | Discharge status: Home / Self Care | Payer: Self-pay | Attending: Emergency Medicine | Admitting: Emergency Medicine

## 2018-11-10 DIAGNOSIS — I10 Essential (primary) hypertension: Secondary | ICD-10-CM | POA: Insufficient documentation

## 2018-11-10 DIAGNOSIS — J029 Acute pharyngitis, unspecified: Secondary | ICD-10-CM | POA: Insufficient documentation

## 2018-11-10 DIAGNOSIS — F17210 Nicotine dependence, cigarettes, uncomplicated: Secondary | ICD-10-CM | POA: Insufficient documentation

## 2018-11-10 DIAGNOSIS — Z20828 Contact with and (suspected) exposure to other viral communicable diseases: Secondary | ICD-10-CM | POA: Insufficient documentation

## 2018-11-10 LAB — SARS CORONAVIRUS 2 (TAT 6-24 HRS): SARS Coronavirus 2: NEGATIVE

## 2018-11-10 MED ORDER — IBUPROFEN 600 MG PO TABS: 600.0000 mg | ORAL_TABLET | Freq: Four times a day (QID) | ORAL | 0 refills | Status: DC | PRN

## 2018-11-10 MED ORDER — IBUPROFEN 200 MG PO TABS
600.0000 mg | ORAL_TABLET | Freq: Once | ORAL | Status: AC
  Administered 2018-11-10: 12:00:00 600 mg via ORAL
  Filled 2018-11-10: qty 3

## 2018-11-10 NOTE — ED Provider Notes (Signed)
[facility]-EMERGENCY DEPT Provider Note   CSN: [id number] Arrival date & time: 11/10/18  1110     History   Chief Complaint Chief Complaint  Patient presents with  . Sore Throat    HPI Samuel Greer is a 28 y.o. male w/ pmhx of obesity presented to the emergency department with sore throat.  He reports onset of symptoms approximately 2 to 3 days ago.  Reports pain with swallowing although is able to keep down food and fluids.  He denies any fevers at home.  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  The patient has been self isolating at a hotel and not visiting his grandfather since symptoms started.  He denies any cough, congestion, shortness of breath, chest pain, headache, myalgias, diarrhea.  He denies any sick contacts around the house.  He denies any history of peritonsillar abscess, recent dental surgery, or anaphylaxis.     HPI  Past Medical History:  Diagnosis Date  . Hypertension   . Migraine     There are no active problems to display for this patient.   Past Surgical History:  Procedure Laterality Date  . HIP SURGERY    . SHOULDER SURGERY     left        Home Medications    Prior to Admission medications   Medication Sig Start Date End Date Taking? Authorizing Provider  albuterol (PROVENTIL HFA;VENTOLIN HFA) 108 (90 Base) MCG/ACT inhaler Inhale 2 puffs into the lungs every 6 (six) hours as needed for wheezing or shortness of breath.    [provider]  amoxicillin (AMOXIL) 500 MG capsule Take 1 capsule (500 mg total) by mouth 3 (three) times daily. 12/09/17   Azalia Bilisampos, Kevin, MD  cyclobenzaprine (FLEXERIL) 10 MG tablet Take 1 tablet (10 mg total) by mouth 2 (two) times daily as needed for muscle spasms. 02/25/17   Fawze, Mina A, PA-C  ibuprofen (ADVIL) 600 MG tablet Take 1 tablet (600 mg total) by mouth every 6 (six) hours as needed for  mild pain or moderate pain. 11/10/18   Terald Sleeperrifan, Daveda Larock J, MD  ibuprofen (ADVIL,MOTRIN) 800 MG tablet Take 1 tablet (800 mg total) by mouth every 8 (eight) hours as needed for mild pain or moderate pain. 08/11/15   Trixie DredgeWest, Emily, PA-C  promethazine (PHENERGAN) 25 MG tablet Take 1 tablet (25 mg total) by mouth every 6 (six) hours as needed for nausea or vomiting. 04/12/15   Ward, Layla MawKristen N, DO    Family History Family History  Problem Relation Age of Onset  . Cancer Other   . Diabetes Other     Social History Social History   Tobacco Use  . Smoking status: Current Every Day Smoker    Packs/day: 0.50    Types: Cigarettes  . Smokeless tobacco: Never Used  Substance Use Topics  . Alcohol use: Yes    Comment: social  . Drug use: No     Allergies   Patient has no known allergies.   Review of Systems Review of Systems  Constitutional: Negative for chills and fever.  HENT: Positive for sore throat. Negative for dental problem, drooling, ear pain, rhinorrhea, sinus pain and trouble swallowing.   Eyes: Negative for pain and visual disturbance.  Respiratory: Negative for cough, shortness of breath, wheezing and stridor.   Cardiovascular: Negative for chest pain and palpitations.  Gastrointestinal: Negative for abdominal pain, nausea  and vomiting.  Musculoskeletal: Negative for arthralgias and myalgias.  Skin: Negative for rash and wound.  Neurological: Negative for seizures and syncope.  Psychiatric/Behavioral: Negative for agitation and confusion.  All other systems reviewed and are negative.    Physical Exam Updated Vital Signs BP 134/89   Pulse 82   Temp 98.4 F (36.9 C) (Oral)   Resp 18   Ht 5\' 6"  (1.676 m)   Wt (!) 145.2 kg   SpO2 98%   BMI 51.65 kg/m   Physical Exam Vitals signs and nursing note reviewed.  Constitutional:      Appearance: He is obese. He is not ill-appearing.  HENT:     Head: Normocephalic and atraumatic.     Mouth/Throat:     Mouth: Mucous  membranes are dry. No oral lesions.     Dentition: No dental caries.     Tongue: No lesions.     Pharynx: No pharyngeal swelling or uvula swelling.     Tonsils: No tonsillar exudate or tonsillar abscesses.  Eyes:     Conjunctiva/sclera: Conjunctivae normal.  Neck:     Musculoskeletal: Normal range of motion and neck supple.  Cardiovascular:     Rate and Rhythm: Normal rate and regular rhythm.     Heart sounds: No murmur.  Pulmonary:     Effort: Pulmonary effort is normal. No respiratory distress.     Breath sounds: Normal breath sounds.  Abdominal:     Palpations: Abdomen is soft.     Tenderness: There is no abdominal tenderness.  Skin:    General: Skin is warm and dry.  Neurological:     Mental Status: He is alert.      ED Treatments / Results  Labs (all labs ordered are listed, but only abnormal results are displayed) Labs Reviewed  SARS CORONAVIRUS 2 (TAT 6-24 HRS)    EKG None  Radiology No results found.  Procedures Procedures (including critical care time)  Medications Ordered in ED Medications  ibuprofen (ADVIL) tablet 600 mg (600 mg Oral Given 11/10/18 1220)     Initial Impression / Assessment and Plan / ED Course  I have reviewed the triage vital signs and the nursing notes.  Pertinent labs & imaging results that were available during my care of the patient were reviewed by me and considered in my medical decision making (see chart for details).   28 year old male presented to the emergency department sore throat for 2 to 3 days.  He is extremely well-appearing on exam and demonstrates no evidence of airway swelling or obstruction.  He has no signs or symptoms of peritonsillar abscess.  He shows no signs or symptoms of pneumonia.  I have a low suspicion for strep throat given his age and lack of tonsillar exudates.    I suspect viral pharyngitis is the most likely cause of his symptoms.  We will swab him for COVID given his family living situation, as his  grandfather may be a vulnerable population.  I advised him to keep isolated from his grandfather until he knows his results.  He has not been taking any anti-inflammatories at home.  We will give him ibuprofen here and advised him to take some at home for pain.     Final Clinical Impressions(s) / ED Diagnoses   Final diagnoses:  Viral pharyngitis    ED Discharge Orders         Ordered    ibuprofen (ADVIL) 600 MG tablet  Every 6 hours PRN  11/10/18 1203           Wyvonnia Dusky, MD 11/10/18 1819

## 2018-11-10 NOTE — ED Triage Notes (Signed)
Patient c/o sore throat x2 days. Denies cough, chest pain, and SOB. Denies N/V/D.

## 2019-04-03 ENCOUNTER — Emergency Department (HOSPITAL_COMMUNITY)
Admission: EM | Admit: 2019-04-03 | Discharge: 2019-04-03 | Disposition: A | Discharge status: Home / Self Care | Payer: Self-pay | Attending: Emergency Medicine | Admitting: Emergency Medicine

## 2019-04-03 ENCOUNTER — Other Ambulatory Visit: Payer: Self-pay

## 2019-04-03 ENCOUNTER — Encounter (HOSPITAL_COMMUNITY): Payer: Self-pay

## 2019-04-03 ENCOUNTER — Emergency Department (HOSPITAL_COMMUNITY): Payer: Self-pay

## 2019-04-03 DIAGNOSIS — Z79899 Other long term (current) drug therapy: Secondary | ICD-10-CM | POA: Insufficient documentation

## 2019-04-03 DIAGNOSIS — F17210 Nicotine dependence, cigarettes, uncomplicated: Secondary | ICD-10-CM | POA: Insufficient documentation

## 2019-04-03 DIAGNOSIS — M545 Low back pain: Secondary | ICD-10-CM | POA: Insufficient documentation

## 2019-04-03 DIAGNOSIS — G8929 Other chronic pain: Secondary | ICD-10-CM | POA: Insufficient documentation

## 2019-04-03 DIAGNOSIS — I10 Essential (primary) hypertension: Secondary | ICD-10-CM | POA: Insufficient documentation

## 2019-04-03 HISTORY — DX: Dorsalgia, unspecified: M54.9

## 2019-04-03 MED ORDER — PREDNISONE 20 MG PO TABS
60.0000 mg | ORAL_TABLET | Freq: Once | ORAL | Status: AC
  Administered 2019-04-03: 15:00:00 60 mg via ORAL
  Filled 2019-04-03: qty 3

## 2019-04-03 MED ORDER — ACETAMINOPHEN 325 MG PO TABS
650.0000 mg | ORAL_TABLET | Freq: Once | ORAL | Status: AC
  Administered 2019-04-03: 15:00:00 650 mg via ORAL
  Filled 2019-04-03: qty 2

## 2019-04-03 MED ORDER — NAPROXEN 500 MG PO TABS: 500.0000 mg | ORAL_TABLET | Freq: Two times a day (BID) | ORAL | 0 refills | Status: AC | PRN

## 2019-04-03 MED ORDER — LIDOCAINE 5 % EX PTCH
1.0000 | MEDICATED_PATCH | Freq: Once | CUTANEOUS | Status: DC
  Administered 2019-04-03: 1 via TRANSDERMAL
  Filled 2019-04-03: qty 1

## 2019-04-03 MED ORDER — KETOROLAC TROMETHAMINE 30 MG/ML IJ SOLN
30.0000 mg | Freq: Once | INTRAMUSCULAR | Status: AC
  Administered 2019-04-03: 30 mg via INTRAMUSCULAR
  Filled 2019-04-03: qty 1

## 2019-04-03 MED ORDER — HYDROCODONE-ACETAMINOPHEN 5-325 MG PO TABS: 2.0000 | ORAL_TABLET | ORAL | 0 refills | Status: DC | PRN

## 2019-04-03 MED ORDER — CYCLOBENZAPRINE HCL 10 MG PO TABS: 10.0000 mg | ORAL_TABLET | Freq: Two times a day (BID) | ORAL | 0 refills | Status: DC | PRN

## 2019-04-03 NOTE — Discharge Instructions (Addendum)
You have been seen today for back pain. Please read and follow all provided instructions. Return to the emergency room for worsening condition or new concerning symptoms.    1. Medications:  Prescription sent to the pharmacy for Flexeril.  This a muscle relaxer.  Please take as prescribed.  If you make you drowsy so do not drive or work while taking this medicine. -Prescription also sent for Norco.  Please take this as prescribed.  This is a narcotic prescription and it for severe pain only. -Prescription sent for naproxen.  This is an anti-inflammatory.  Please take as prescribed.  This medicine is similar to ibuprofen, Aleve, Motrin do not take those while taking this medicine.  Take this medicine with food as it can cause upset stomach.  Continue usual home medications Take medications as prescribed. Please review all of the medicines and only take them if you do not have an allergy to them.   2. Treatment: rest, drink plenty of fluids  3. Follow Up:  Please follow up with primary care provider by scheduling an appointment as soon as possible for a visit  If you do not have a primary care physician, contact [facility] at [phone] for referral -Also recommend you follow-up with neurosurgery as we discussed.  Call the office listed above.   -Blood pressure was elevated today.  You have a history of hypertension and should have your blood pressure rechecked by a primary care doctor to see if you need to take medication for this.  Please have your blood pressure rechecked within 1 week.   It is also a possibility that you have an allergic reaction to any of the medicines that you have been prescribed - Everybody reacts differently to medications and while MOST people have no trouble with most medicines, you may have a reaction such as nausea, vomiting, rash, swelling, shortness of breath. If this is the case, please stop taking the medicine immediately and contact your physician.  ?

## 2019-04-03 NOTE — ED Notes (Signed)
Pt verbalizes understanding of DC instructions. Pt belongings returned and is ambulatory out of ED.  

## 2019-04-03 NOTE — ED Notes (Addendum)
Pt ambulatory to imaging.  

## 2019-04-03 NOTE — ED Triage Notes (Signed)
Patient c/o bilateral lower back pain and states when he stands the pain is worse on the left. Patient denies any injury or radiation of pain.
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
## 2019-04-06 ENCOUNTER — Telehealth: Payer: Self-pay | Admitting: *Deleted

## 2019-04-06 NOTE — Telephone Encounter (Signed)
TOC CM chart reviewed. 2 ED visits in 6 months, no PCP/insurance. Has hx of HTN, and back pain. Contacted pt and states he does not have PCP or insurance. Explained CM will arrange follow up appt at [facility] and educated him on the importance of follow up with PCP. Verbalized understanding. Educated his initial appt not to miss appt, he will need to discuss with PCP possibly seeing a specialist for his back pain. Educated pt on process of follow up with a specialist that he would have to pay initial office visit ~$260.00, but the specialist can manage his pain. Appt arrange at [facility] on 2/12//2021 at 2:30 pm and gave pt information on pharmacy to pick up medications at discounted rate after he leaves his appt.Isidoro Donning RN CCM, [facility] ED TOC CM [phone]

## 2019-04-17 ENCOUNTER — Ambulatory Visit: Payer: Self-pay | Attending: Family Medicine | Admitting: Family Medicine

## 2019-04-17 ENCOUNTER — Other Ambulatory Visit: Payer: Self-pay

## 2019-04-17 ENCOUNTER — Encounter: Payer: Self-pay | Admitting: Family Medicine

## 2019-04-17 DIAGNOSIS — M545 Low back pain, unspecified: Secondary | ICD-10-CM

## 2019-04-17 DIAGNOSIS — G8929 Other chronic pain: Secondary | ICD-10-CM

## 2019-04-17 MED ORDER — CYCLOBENZAPRINE HCL 10 MG PO TABS: 10.0000 mg | ORAL_TABLET | Freq: Every day | ORAL | 3 refills | Status: DC

## 2019-04-17 MED ORDER — TRAMADOL HCL 50 MG PO TABS: 50.0000 mg | ORAL_TABLET | Freq: Three times a day (TID) | ORAL | 0 refills | Status: AC | PRN

## 2019-04-17 NOTE — Progress Notes (Signed)
Virtual Visit via Telephone Note  I connected with Samuel Greer on 04/17/19 at  2:30 PM EST by telephone and verified that I am speaking with the correct person using two identifiers.   I discussed the limitations, risks, security and privacy concerns of performing an evaluation and management service by telephone and the availability of in person appointments. I also discussed with the patient that there may be a patient responsible charge related to this service. The patient expressed understanding and agreed to proceed.  Patient Location: Home Provider Location: [facility] Others participating in call: none   History of Present Illness:        29 yo male who is seen in follow-up of low back pain without radiation to the buttocks or legs. Pain comes and goes and more so in the winter. He still has some pain but not as bad as when he was in the ED. No bowel or bladder dysfunctional. He reports that he has been taking hydrocodone for a few days for the pain but that this is causing him some nausea and vomiting. He would like a refill of this medication to take for pain.  He would also like refill of medication for treatment of muscle spasms.  His current pain ranges from a 4-6 on a 0-to-10 scale.  Pain in his low back is sharp.  Pain is increased with activity such as walking or prolonged standing.   Past Medical History:  Diagnosis Date  . Back pain   . Hypertension   . Migraine     Past Surgical History:  Procedure Laterality Date  . HIP SURGERY    . SHOULDER SURGERY     left    Family History  Problem Relation Age of Onset  . Cancer Other   . Diabetes Other   . Diabetes Mother   . Hypertension Mother     Social History   Tobacco Use  . Smoking status: Current Every Day Smoker    Packs/day: 0.15    Types: Cigarettes  . Smokeless tobacco: Never Used  Substance Use Topics  . Alcohol use: Yes    Comment: social  . Drug use: Yes    Types: Marijuana     No Known  Allergies     Observations/Objective: No vital signs or physical exam conducted as visit was done via telephone  Assessment and Plan: 1. Chronic midline low back pain without sciatica; 2.  Evaluation for examination following treatment at hospital Patient's emergency department notes from his visit on 04/03/2019 reviewed and discussed with the patient.  He has had recurrent issues with low back pain.  Imaging of the lumbar spine showed no acute abnormality however patient with chronically age advanced lumbar disc and endplate degeneration and multilevel spinal stenosis suspected on the basis of a 2013 CT of the abdomen and pelvis per radiology report.  Patient was offered orthopedic referral which she declines at this time.  Discussed with the patient that this office does not dispense hydrocodone but will send prescription to patient's pharmacy for tramadol short-term for acute pain as well as refill of cyclobenzaprine.  Patient is encouraged to schedule in person follow-up of chronic back pain as well as his hypertension as blood pressure was also elevated at his recent emergency department visit and he has history of hypertension. - cyclobenzaprine (FLEXERIL) 10 MG tablet; Take 1 tablet (10 mg total) by mouth at bedtime.  Dispense: 30 tablet; Refill: 3 - traMADol (ULTRAM) 50 MG tablet; Take  1 tablet (50 mg total) by mouth every 8 (eight) hours as needed for up to 5 days. Eat before taking medication  Dispense: 15 tablet; Refill: 0  Follow Up Instructions: Return in about 6 weeks (around 05/29/2019) for Back pain/hypertension.    I discussed the assessment and treatment plan with the patient. The patient was provided an opportunity to ask questions and all were answered. The patient agreed with the plan and demonstrated an understanding of the instructions.   The patient was advised to call back or seek an in-person evaluation if the symptoms worsen or if the condition fails to improve as  anticipated.  I provided 8 minutes of non-face-to-face time during this encounter.   Antony Blackbird, MD

## 2019-05-12 ENCOUNTER — Telehealth: Payer: Self-pay | Admitting: *Deleted

## 2019-05-12 NOTE — Telephone Encounter (Signed)
TOC CM received a voice message from pt stating he did not get a rx for blood pressure. Reviewed [facility] visit note and no bp meds prescribed. CM contacted pt and left HIPAA compliant voice message for a return call. Isidoro Donning RN CCM, [facility] ED TOC CM [phone]

## 2019-06-24 ENCOUNTER — Ambulatory Visit: Payer: Self-pay | Admitting: Family Medicine

## 2019-07-10 ENCOUNTER — Ambulatory Visit: Payer: Self-pay | Admitting: Family Medicine

## 2019-08-29 ENCOUNTER — Encounter (HOSPITAL_COMMUNITY): Payer: Self-pay

## 2019-08-29 ENCOUNTER — Ambulatory Visit (HOSPITAL_COMMUNITY)
Admission: EM | Admit: 2019-08-29 | Discharge: 2019-08-29 | Disposition: A | Discharge status: Home / Self Care | Payer: Self-pay | Attending: Physician Assistant | Admitting: Physician Assistant

## 2019-08-29 ENCOUNTER — Emergency Department (HOSPITAL_COMMUNITY): Admission: EM | Admit: 2019-08-29 | Discharge: 2019-08-29 | Discharge status: Against Medical Advice | Payer: Self-pay

## 2019-08-29 ENCOUNTER — Other Ambulatory Visit: Payer: Self-pay

## 2019-08-29 DIAGNOSIS — M545 Low back pain, unspecified: Secondary | ICD-10-CM

## 2019-08-29 DIAGNOSIS — M62830 Muscle spasm of back: Secondary | ICD-10-CM

## 2019-08-29 DIAGNOSIS — G8929 Other chronic pain: Secondary | ICD-10-CM

## 2019-08-29 DIAGNOSIS — I10 Essential (primary) hypertension: Secondary | ICD-10-CM

## 2019-08-29 MED ORDER — CYCLOBENZAPRINE HCL 10 MG PO TABS: 10.0000 mg | ORAL_TABLET | Freq: Three times a day (TID) | ORAL | 3 refills | Status: DC | PRN

## 2019-08-29 MED ORDER — PREDNISONE 10 MG (21) PO TBPK: ORAL_TABLET | Freq: Every day | ORAL | 0 refills | Status: DC

## 2019-08-29 NOTE — ED Provider Notes (Signed)
[facility]    CSN: [id number] Arrival date & time: 08/29/19  1303      History   Chief Complaint Chief Complaint  Patient presents with  . Back Pain    HPI Samuel Greer is a 29 y.o. male.   Who carries a history of chronic back pain secondary to degenerative disc disease, spinal stenosis and obesity. He presents with acute on chronic back after a twisting injury this am. He bent over to pick up his phone and felt a pop. He notes lower back and "band like" pain to the right side. He has no weakness in his extremities. No numbness or tingling. Pain with ambulation is noted. He is still awaiting his referral to orthopedics he says.      Past Medical History:  Diagnosis Date  . Back pain   . Hypertension   . Migraine     There are no problems to display for this patient.   Past Surgical History:  Procedure Laterality Date  . HIP SURGERY    . SHOULDER SURGERY     left       Home Medications    Prior to Admission medications   Medication Sig Start Date End Date Taking? Authorizing Provider  albuterol (PROVENTIL HFA;VENTOLIN HFA) 108 (90 Base) MCG/ACT inhaler Inhale 2 puffs into the lungs every 6 (six) hours as needed for wheezing or shortness of breath.    [provider]  cyclobenzaprine (FLEXERIL) 10 MG tablet Take 1 tablet (10 mg total) by mouth at bedtime. 04/17/19   Fulp, Cammie, MD  HYDROcodone-acetaminophen (NORCO/VICODIN) 5-325 MG tablet Take 2 tablets by mouth every 4 (four) hours as needed. 04/03/19   Albrizze, Caroleen Hamman, PA-C    Family History Family History  Problem Relation Age of Onset  . Cancer Other   . Diabetes Other   . Diabetes Mother   . Hypertension Mother     Social History Social History   Tobacco Use  . Smoking status: Current Every Day Smoker    Packs/day: 0.15    Types: Cigarettes  . Smokeless tobacco: Never Used  Vaping Use  . Vaping Use: Never used  Substance Use Topics  . Alcohol use: Yes     Comment: social  . Drug use: Yes    Types: Marijuana     Allergies   Patient has no known allergies.   Review of Systems Review of Systems  Musculoskeletal: Positive for back pain, gait problem and myalgias.  Skin: Negative.   Neurological: Negative for dizziness and weakness.  All other systems reviewed and are negative.    Physical Exam Triage Vital Signs ED Triage Vitals  Enc Vitals Group     BP 08/29/19 1313 (!) 170/122     Pulse --      Resp 08/29/19 1313 19     Temp 08/29/19 1313 98.2 F (36.8 C)     Temp Source 08/29/19 1313 Oral     SpO2 08/29/19 1313 99 %     Weight --      Height --      Head Circumference --      Peak Flow --      Pain Score 08/29/19 1315 7     Pain Loc --      Pain Edu? --      Excl. in GC? --    No data found.  Updated Vital Signs BP (!) 170/122 (BP Location: Right Wrist)   Temp 98.2 F (36.8  C) (Oral)   Resp 19   SpO2 99%   Visual Acuity Right Eye Distance:   Left Eye Distance:   Bilateral Distance:    Right Eye Near:   Left Eye Near:    Bilateral Near:     Physical Exam Vitals and nursing note reviewed.  Constitutional:      General: He is not in acute distress.    Appearance: Normal appearance. He is obese. He is not ill-appearing.  HENT:     Head: Normocephalic and atraumatic.  Pulmonary:     Effort: Pulmonary effort is normal.  Skin:    General: Skin is warm and dry.     Findings: No rash.  Neurological:     General: No focal deficit present.     Mental Status: He is alert and oriented to person, place, and time.     Motor: No weakness.     Coordination: Coordination normal.     Gait: Gait abnormal.     Deep Tendon Reflexes: Reflexes normal.     Comments: Pain with extension of the lumbar spine, full flexion, pain along the right para lumbar region without evidence of a frank spasm  Psychiatric:        Mood and Affect: Mood normal.      UC Treatments / Results  Labs (all labs ordered are listed,  but only abnormal results are displayed) Labs Reviewed - No data to display  EKG   Radiology No results found.  Procedures Procedures (including critical care time)  Medications Ordered in UC Medications - No data to display  Initial Impression / Assessment and Plan / UC Course  I have reviewed the triage vital signs and the nursing notes.  Pertinent labs & imaging results that were available during my care of the patient were reviewed by me and considered in my medical decision making (see chart for details).     Treat acutely for acute on chronic back pain. Pred pack and Flexeril and gentle ROM exercises. Past MRI records were reviewed. Again agree with establishing care with an orthopedic or spine specialist.  Final Clinical Impressions(s) / UC Diagnoses   Final diagnoses:  None   Discharge Instructions   None    ED Prescriptions    None     PDMP not reviewed this encounter.   Bjorn Pippin, [place] 08/29/19 1336

## 2019-08-29 NOTE — Discharge Instructions (Signed)
Most likely a facet issue or sprain. Treat with pred pack and muscle relaxer's with gentle stretching. Please f/u with an orthopedic or spine specialist for further help with this. Also your BP is elevated. Could be in the setting of pain, but please recheck this when feeling better and if still elevated then f/u with your PCP for further evaluation.

## 2019-08-29 NOTE — ED Triage Notes (Signed)
Pt c/o acute lower back pain after bending over to pick his phone up from the floor approx 2 hours PTA. Pt states that "it felt like something shifted when I tried to stand up." Pt states recurrent injuries/pain to lower back over the several years.   Denies numbness to feet/toes. Difficulty sitting 2/2 pain. No OTC meds/ice yet today.

## 2020-01-24 ENCOUNTER — Encounter (HOSPITAL_COMMUNITY): Payer: Self-pay | Admitting: Emergency Medicine

## 2020-01-24 ENCOUNTER — Emergency Department (HOSPITAL_COMMUNITY)
Admission: EM | Admit: 2020-01-24 | Discharge: 2020-01-24 | Disposition: A | Discharge status: Home / Self Care | Payer: 59 | Attending: Emergency Medicine | Admitting: Emergency Medicine

## 2020-01-24 ENCOUNTER — Other Ambulatory Visit: Payer: Self-pay

## 2020-01-24 DIAGNOSIS — L0231 Cutaneous abscess of buttock: Secondary | ICD-10-CM | POA: Insufficient documentation

## 2020-01-24 DIAGNOSIS — I10 Essential (primary) hypertension: Secondary | ICD-10-CM | POA: Diagnosis not present

## 2020-01-24 DIAGNOSIS — F17210 Nicotine dependence, cigarettes, uncomplicated: Secondary | ICD-10-CM | POA: Diagnosis not present

## 2020-01-24 MED ORDER — DOXYCYCLINE HYCLATE 100 MG PO CAPS: 100.0000 mg | ORAL_CAPSULE | Freq: Two times a day (BID) | ORAL | 0 refills | Status: AC

## 2020-01-24 MED ORDER — LIDOCAINE HCL (PF) 1 % IJ SOLN
5.0000 mL | Freq: Once | INTRAMUSCULAR | Status: AC
  Administered 2020-01-24: 5 mL via INTRADERMAL
  Filled 2020-01-24: qty 30

## 2020-01-24 MED ORDER — OXYCODONE-ACETAMINOPHEN 5-325 MG PO TABS
1.0000 | ORAL_TABLET | Freq: Once | ORAL | Status: AC
  Administered 2020-01-24: 1 via ORAL
  Filled 2020-01-24: qty 1

## 2020-01-24 NOTE — ED Triage Notes (Signed)
Patient here from home report abscess to left buttocks x3 days.

## 2020-01-24 NOTE — ED Notes (Signed)
Lidocaine at bedside along with incision and drainage tray.

## 2020-01-24 NOTE — ED Provider Notes (Signed)
[facility]-EMERGENCY DEPT Provider Note   CSN: [id number] Arrival date & time: 01/24/20  0175     History Chief Complaint  Patient presents with  . Abscess    Samuel Greer is a 29 y.o. male who presents with concern for enlarging "boil" on his left inferior buttock where it meets his thigh.  He states he has issues with these in the past, however they resolved when he switched to a more mild soap.  He is that recently his girlfriend bought him some new soap from [employer], and after using this for the first time he developed this boil.  He states has been there for approximately week but has become more painful in the last 2 to 3 days.  It has become difficult for him to sit comfortably.  He has been using Tylenol at home for pain, he has been applying alcohol to the area.  He has not utilized warm compresses at home.  Denies drainage from abscess.  He denies fevers, chills at home, denies abdominal pain, nausea, vomiting, diarrhea.  He he states he had a normal bowel movement today, but that it was quite painful to bear down to pass stool, due to sore on his buttock.  I personally reviewed this patient's medical records.  He has history of migraines, back pain, hypertension; history of hip surgery and shoulder surgery.  He denies IV drug use, illicit drug use.  HPI     Past Medical History:  Diagnosis Date  . Back pain   . Hypertension   . Migraine     There are no problems to display for this patient.   Past Surgical History:  Procedure Laterality Date  . HIP SURGERY    . SHOULDER SURGERY     left       Family History  Problem Relation Age of Onset  . Cancer Other   . Diabetes Other   . Diabetes Mother   . Hypertension Mother     Social History   Tobacco Use  . Smoking status: Current Every Day Smoker    Packs/day: 0.15    Types: Cigarettes  . Smokeless tobacco: Never Used  Vaping Use  . Vaping Use: Never used  Substance  Use Topics  . Alcohol use: Yes    Comment: social  . Drug use: Yes    Types: Marijuana    Home Medications Prior to Admission medications   Medication Sig Start Date End Date Taking? Authorizing Provider  albuterol (PROVENTIL HFA;VENTOLIN HFA) 108 (90 Base) MCG/ACT inhaler Inhale 2 puffs into the lungs every 6 (six) hours as needed for wheezing or shortness of breath.    [provider]  cyclobenzaprine (FLEXERIL) 10 MG tablet Take 1 tablet (10 mg total) by mouth 3 (three) times daily as needed for muscle spasms. 08/29/19   Riki Sheer, PA-C  HYDROcodone-acetaminophen (NORCO/VICODIN) 5-325 MG tablet Take 2 tablets by mouth every 4 (four) hours as needed. 04/03/19   Walisiewicz, Caroleen Hamman, PA-C  predniSONE (STERAPRED UNI-PAK 21 TAB) 10 MG (21) TBPK tablet Take by mouth daily. Take 6 tabs by mouth daily  for 2 days, then 5 tabs for 2 days, then 4 tabs for 2 days, then 3 tabs for 2 days, 2 tabs for 2 days, then 1 tab by mouth daily for 2 days 08/29/19   Riki Sheer, PA-C    Allergies    Patient has no known allergies.  Review of Systems  Review of Systems  Constitutional: Negative for activity change, appetite change, chills, diaphoresis, fatigue and fever.  HENT: Negative.   Respiratory: Negative for chest tightness, shortness of breath and wheezing.   Cardiovascular: Negative for chest pain, palpitations and leg swelling.  Gastrointestinal: Negative for abdominal pain and nausea.  Genitourinary: Negative for dysuria, frequency, hematuria and urgency.  Musculoskeletal: Negative.   Skin: Positive for wound.       Abscess to left buttock.  Allergic/Immunologic: Negative for immunocompromised state.  Neurological: Negative.   Hematological: Negative.     Physical Exam Updated Vital Signs BP (!) 148/101 (BP Location: Right Arm)   Pulse 89   Temp 98 F (36.7 C) (Oral)   Resp 19   SpO2 99%   Physical Exam Vitals and nursing note reviewed.  Constitutional:       Appearance: He is obese.  HENT:     Head: Normocephalic and atraumatic.     Mouth/Throat:     Mouth: Mucous membranes are moist.     Pharynx: No oropharyngeal exudate or posterior oropharyngeal erythema.  Eyes:     General:        Right eye: No discharge.        Left eye: No discharge.     Conjunctiva/sclera: Conjunctivae normal.     Pupils: Pupils are equal, round, and reactive to light.  Cardiovascular:     Rate and Rhythm: Normal rate and regular rhythm.     Pulses: Normal pulses.     Heart sounds: Normal heart sounds. No murmur heard.   Pulmonary:     Effort: Pulmonary effort is normal. No respiratory distress.     Breath sounds: Normal breath sounds. No wheezing or rales.  Abdominal:     General: Bowel sounds are normal. There is no distension.     Palpations: Abdomen is soft.     Tenderness: There is no abdominal tenderness.  Musculoskeletal:        General: No deformity.     Cervical back: Neck supple.     Right lower leg: No edema.     Left lower leg: No edema.  Skin:    General: Skin is warm and dry.     Capillary Refill: Capillary refill takes less than 2 seconds.       Neurological:     General: No focal deficit present.     Mental Status: He is alert and oriented to person, place, and time. Mental status is at baseline.  Psychiatric:        Mood and Affect: Mood normal.     ED Results / Procedures / Treatments   Labs (all labs ordered are listed, but only abnormal results are displayed) Labs Reviewed - No data to display  EKG None  Radiology No results found.  Procedures .Marland KitchenIncision and Drainage  Date/Time: 01/24/2020 11:21 AM Performed by: Paris Lore, PA-C Authorized by: Paris Lore, PA-C   Consent:    Consent obtained:  Verbal   Consent given by:  Patient   Risks discussed:  Bleeding, incomplete drainage, pain and damage to other organs   Alternatives discussed:  No treatment Universal protocol:    Procedure  explained and questions answered to patient or proxy's satisfaction: yes     Relevant documents present and verified: yes     Test results available and properly labeled: yes     Imaging studies available: yes     Required blood products, implants, devices, and special equipment available: yes  Site/side marked: yes     Immediately prior to procedure a time out was called: yes     Patient identity confirmed:  Verbally with patient Location:    Type:  Abscess   Location:  Lower extremity   Lower extremity location:  Buttock   Buttock location:  L buttock Pre-procedure details:    Skin preparation:  Betadine Anesthesia (see MAR for exact dosages):    Anesthesia method:  Local infiltration   Local anesthetic:  Lidocaine 1% w/o epi Procedure type:    Complexity:  Complex Procedure details:    Incision types:  Single straight   Incision depth:  Subcutaneous   Scalpel blade:  11   Wound management:  Probed and deloculated, irrigated with saline and extensive cleaning   Drainage:  Purulent   Drainage amount:  Copious   Wound treatment:  Wound left open   Packing materials:  None Post-procedure details:    Patient tolerance of procedure:  Tolerated well, no immediate complications Comments:     Copious purulent gray drainage from the wound, very foul-smelling.  Good hemostasis following procedure.  Patient tolerated very well.   (including critical care time)  Medications Ordered in ED Medications  oxyCODONE-acetaminophen (PERCOCET/ROXICET) 5-325 MG per tablet 1 tablet (1 tablet Oral Given 01/24/20 1026)  lidocaine (PF) (XYLOCAINE) 1 % injection 5 mL (5 mLs Intradermal Given by Other 01/24/20 1120)    ED Course  I have reviewed the triage vital signs and the nursing notes.  Pertinent labs & imaging results that were available during my care of the patient were reviewed by me and considered in my medical decision making (see chart for details).    MDM Rules/Calculators/A&P                          29 year old male who presents with abscess to left buttock x 1 week.  History of similar abscesses, history of MRSA infection.   Patient hypertensive on intake 140/101.  Afebrile.  Vital signs otherwise normal.  Physical exam significant for 3.5 cm abscess to the left inferior buttock, amenable to drainage.  Analgesia offered; will proceed with I&D.  Abscess drained as noted above, patient tolerated very well.   Patient with history of MRSA infection in the past, will discharge with course of doxycycline.  At this time no further work-up is necessary in the emergency department.  May use over-the-counter analgesia as needed, infection precautions given.  Evon voiced understanding of his medical evaluation and treatment plan.  Each of his questions were answered to his expressed satisfaction. Return precautions were given.  Patient is stable for discharge.  Final Clinical Impression(s) / ED Diagnoses Final diagnoses:  None    Rx / DC Orders ED Discharge Orders    None       Paris Lore, PA-C 01/24/20 1127    Melene Plan, DO 01/24/20 1128

## 2020-01-24 NOTE — Discharge Instructions (Addendum)
You were evaluated in the emergency department for the pain and swelling on your left buttock.  Your vital signs were very reassuring.    Physical exam revealed abscess to your left lower buttock, near your thigh.  Your abscess was drained in the emergency department; a significant amount of infected fluid was drained from the abscess.  Please be aware that this wound was left open, and will likely drain pus and a small amount of blood over the next few days.  You may want to keep the area covered with a small bandage to keep your clothing and bedding clean.  You've been prescribed 10 days worth of antibiotics, called doxycycline.  It will be important you take the entire course of antibiotics to completely clear your infection.  Please be aware that this antibiotic may make you more sensitive to the sun.  You may utilize Tylenol and ibuprofen as needed for discomfort at home.  You may continue to use warm compresses to the site to encourage drainage of any remaining infected fluid.  You may follow-up with your primary care doctor.  Please return to the emergency department if you develop fevers, chills, worsening pain, swelling to the area despite antibiotic therapy, as these could be signs of worsening infection.  Please return if you develop any other new severe symptoms.

## 2020-03-01 ENCOUNTER — Encounter (HOSPITAL_COMMUNITY): Payer: Self-pay

## 2020-03-01 ENCOUNTER — Emergency Department (HOSPITAL_COMMUNITY)
Admission: EM | Admit: 2020-03-01 | Discharge: 2020-03-01 | Disposition: A | Discharge status: Home / Self Care | Payer: 59 | Attending: Emergency Medicine | Admitting: Emergency Medicine

## 2020-03-01 ENCOUNTER — Other Ambulatory Visit: Payer: Self-pay

## 2020-03-01 DIAGNOSIS — U071 COVID-19: Secondary | ICD-10-CM | POA: Insufficient documentation

## 2020-03-01 DIAGNOSIS — I10 Essential (primary) hypertension: Secondary | ICD-10-CM | POA: Insufficient documentation

## 2020-03-01 DIAGNOSIS — J069 Acute upper respiratory infection, unspecified: Secondary | ICD-10-CM

## 2020-03-01 DIAGNOSIS — F17210 Nicotine dependence, cigarettes, uncomplicated: Secondary | ICD-10-CM | POA: Insufficient documentation

## 2020-03-01 DIAGNOSIS — Z20822 Contact with and (suspected) exposure to covid-19: Secondary | ICD-10-CM

## 2020-03-01 DIAGNOSIS — R0981 Nasal congestion: Secondary | ICD-10-CM | POA: Diagnosis present

## 2020-03-01 LAB — RESP PANEL BY RT-PCR (RSV, FLU A&B, COVID)  RVPGX2
Influenza A by PCR: NEGATIVE
Influenza B by PCR: NEGATIVE
Resp Syncytial Virus by PCR: NEGATIVE
SARS Coronavirus 2 by RT PCR: POSITIVE — AB

## 2020-03-01 NOTE — ED Triage Notes (Signed)
Pt presents with c/o nasal congestion since yesterday. Pt reports he is here for a covid test.

## 2020-03-01 NOTE — Discharge Instructions (Signed)
If the Covid test is positive, you will need to follow isolation precautions as discussed.  If you develop any difficulty in breathing, chest pain or other new concern symptom, return to ER for reassessment. 

## 2020-03-01 NOTE — ED Provider Notes (Signed)
[facility]   CSN: [id number] Arrival date & time: 03/01/20  1241     History Chief Complaint  Patient presents with  . Nasal Congestion    Samuel Greer is a 29 y.o. male.  Presents to the emergency room with request for Covid testing.  Patient reports he has had nasal congestion since yesterday.  Also having general feeling of fatigue, malaise.  Denies any chest pain or difficulty in breathing.  Has mild nonproductive cough.  States that family member has similar symptoms but no known Covid exposures.  Denies any chronic medical problems.  HPI     Past Medical History:  Diagnosis Date  . Back pain   . Hypertension   . Migraine     There are no problems to display for this patient.   Past Surgical History:  Procedure Laterality Date  . HIP SURGERY    . SHOULDER SURGERY     left       Family History  Problem Relation Age of Onset  . Cancer Other   . Diabetes Other   . Diabetes Mother   . Hypertension Mother     Social History   Tobacco Use  . Smoking status: Current Every Day Smoker    Packs/day: 0.15    Types: Cigarettes  . Smokeless tobacco: Never Used  Vaping Use  . Vaping Use: Never used  Substance Use Topics  . Alcohol use: Yes    Comment: social  . Drug use: Yes    Types: Marijuana    Home Medications Prior to Admission medications   Medication Sig Start Date End Date Taking? Authorizing Provider  albuterol (PROVENTIL HFA;VENTOLIN HFA) 108 (90 Base) MCG/ACT inhaler Inhale 2 puffs into the lungs every 6 (six) hours as needed for wheezing or shortness of breath.    [provider]  cyclobenzaprine (FLEXERIL) 10 MG tablet Take 1 tablet (10 mg total) by mouth 3 (three) times daily as needed for muscle spasms. 08/29/19   Riki Sheer, PA-C  HYDROcodone-acetaminophen (NORCO/VICODIN) 5-325 MG tablet Take 2 tablets by mouth every 4 (four) hours as needed. 04/03/19   Walisiewicz, Caroleen Hamman, PA-C  predniSONE (STERAPRED UNI-PAK 21 TAB) 10 MG (21) TBPK tablet Take by mouth daily. Take 6 tabs by mouth daily  for 2 days, then 5 tabs for 2 days, then 4 tabs for 2 days, then 3 tabs for 2 days, 2 tabs for 2 days, then 1 tab by mouth daily for 2 days 08/29/19   Riki Sheer, PA-C    Allergies    Patient has no known allergies.  Review of Systems   Review of Systems  Constitutional: Positive for fatigue. Negative for chills and fever.  HENT: Positive for congestion and sinus pain. Negative for ear pain and sore throat.   Eyes: Negative for pain and visual disturbance.  Respiratory: Positive for cough. Negative for shortness of breath.   Cardiovascular: Negative for chest pain and palpitations.  Gastrointestinal: Negative for abdominal pain and vomiting.  Genitourinary: Negative for dysuria and hematuria.  Musculoskeletal: Negative for arthralgias and back pain.  Skin: Negative for color change and rash.  Neurological: Negative for seizures and syncope.  All other systems reviewed and are negative.   Physical Exam Updated Vital Signs BP (!) 184/99 (BP Location: Right Arm)   Pulse 84   Temp 99.2 F (37.3 C) (Oral)   Resp 18   SpO2 99%   Physical Exam Vitals and  nursing note reviewed.  Constitutional:      Appearance: He is well-developed and well-nourished.  HENT:     Head: Normocephalic and atraumatic.  Eyes:     Conjunctiva/sclera: Conjunctivae normal.  Cardiovascular:     Rate and Rhythm: Normal rate and regular rhythm.     Heart sounds: No murmur heard.   Pulmonary:     Effort: Pulmonary effort is normal. No respiratory distress.     Breath sounds: Normal breath sounds.  Abdominal:     Palpations: Abdomen is soft.     Tenderness: There is no abdominal tenderness.  Musculoskeletal:        General: No deformity, signs of injury or edema.     Cervical back: Neck supple.  Skin:    General: Skin is warm and dry.  Neurological:     General: No focal  deficit present.     Mental Status: He is alert.  Psychiatric:        Mood and Affect: Mood and affect and mood normal.        Behavior: Behavior normal.     ED Results / Procedures / Treatments   Labs (all labs ordered are listed, but only abnormal results are displayed) Labs Reviewed  RESP PANEL BY RT-PCR (RSV, FLU A&B, COVID)  [id number]    EKG None  Radiology No results found.  Procedures Procedures (including critical care time)  Medications Ordered in ED Medications - No data to display  ED Course  I have reviewed the triage vital signs and the nursing notes.  Pertinent labs & imaging results that were available during my care of the patient were reviewed by me and considered in my medical decision making (see chart for details).  Clinical Course as of 03/01/20 1518  Tue Mar 01, 2020  1459 [id number] [RD]    Clinical Course User Index [RD] Milagros Loll, MD   MDM Rules/Calculators/A&P                         29 year old male presents to ER with concern for nasal congestion, mild cough and fatigue.  On exam remarkably well-appearing in no acute distress.  Requesting Covid testing.  Will send for Covid testing.  Will DC home.  If positive, patient will need to be called with results.  Recommended recheck with primary doctor.   After the discussed management above, the patient was determined to be safe for discharge.  The patient was in agreement with this plan and all questions regarding their care were answered.  ED return precautions were discussed and the patient will return to the ED with any significant worsening of condition.  Samuel Greer was evaluated in Emergency Department on 03/01/2020 for the symptoms described in the history of present illness. He was evaluated in the context of the global COVID-19 pandemic, which necessitated consideration that the patient might be at risk for infection with the SARS-CoV-2 virus that causes COVID-19. Institutional  protocols and algorithms that pertain to the evaluation of patients at risk for COVID-19 are in a state of rapid change based on information released by regulatory bodies including the CDC and federal and state organizations. These policies and algorithms were followed during the patient's care in the ED.  Final Clinical Impression(s) / ED Diagnoses Final diagnoses:  Upper respiratory tract infection, unspecified type  Suspected COVID-19 virus infection    Rx / DC Orders ED Discharge Orders    None  Milagros Loll, MD 03/01/20 1520

## 2020-03-16 ENCOUNTER — Emergency Department (HOSPITAL_COMMUNITY)
Admission: EM | Admit: 2020-03-16 | Discharge: 2020-03-16 | Disposition: A | Discharge status: Home / Self Care | Payer: 59 | Attending: Emergency Medicine | Admitting: Emergency Medicine

## 2020-03-16 ENCOUNTER — Other Ambulatory Visit: Payer: Self-pay

## 2020-03-16 DIAGNOSIS — Z5321 Procedure and treatment not carried out due to patient leaving prior to being seen by health care provider: Secondary | ICD-10-CM | POA: Insufficient documentation

## 2020-03-16 DIAGNOSIS — Z1152 Encounter for screening for COVID-19: Secondary | ICD-10-CM | POA: Insufficient documentation

## 2020-07-08 ENCOUNTER — Emergency Department (HOSPITAL_COMMUNITY)
Admission: EM | Admit: 2020-07-08 | Discharge: 2020-07-08 | Disposition: A | Discharge status: Home / Self Care | Payer: Self-pay | Attending: Emergency Medicine | Admitting: Emergency Medicine

## 2020-07-08 ENCOUNTER — Other Ambulatory Visit: Payer: Self-pay

## 2020-07-08 ENCOUNTER — Encounter (HOSPITAL_COMMUNITY): Payer: Self-pay | Admitting: Emergency Medicine

## 2020-07-08 DIAGNOSIS — I10 Essential (primary) hypertension: Secondary | ICD-10-CM | POA: Insufficient documentation

## 2020-07-08 DIAGNOSIS — R42 Dizziness and giddiness: Secondary | ICD-10-CM | POA: Insufficient documentation

## 2020-07-08 DIAGNOSIS — R519 Headache, unspecified: Secondary | ICD-10-CM | POA: Insufficient documentation

## 2020-07-08 DIAGNOSIS — F17210 Nicotine dependence, cigarettes, uncomplicated: Secondary | ICD-10-CM | POA: Insufficient documentation

## 2020-07-08 LAB — BASIC METABOLIC PANEL
Anion gap: 8 (ref 5–15)
BUN: 15 mg/dL (ref 6–20)
CO2: 26 mmol/L (ref 22–32)
Calcium: 8.9 mg/dL (ref 8.9–10.3)
Chloride: 111 mmol/L (ref 98–111)
Creatinine, Ser: 0.91 mg/dL (ref 0.61–1.24)
GFR, Estimated: >60 mL/min (ref >60)
Glucose, Bld: 92 mg/dL (ref 70–99)
Potassium: 4.1 mmol/L (ref 3.5–5.1)
Sodium: 145 mmol/L (ref 135–145)

## 2020-07-08 LAB — CBC
HCT: 43.5 % (ref 39.0–52.0)
Hemoglobin: 13.7 g/dL (ref 13.0–17.0)
MCH: 21.6 pg — ABNORMAL LOW (ref 26.0–34.0)
MCHC: 31.5 g/dL (ref 30.0–36.0)
MCV: 68.5 fL — ABNORMAL LOW (ref 80.0–100.0)
Platelets: 292 10*3/uL (ref 150–400)
RBC: 6.35 MIL/uL — ABNORMAL HIGH (ref 4.22–5.81)
RDW: 18.6 % — ABNORMAL HIGH (ref 11.5–15.5)
WBC: 9.2 10*3/uL (ref 4.0–10.5)
nRBC: 0 % (ref 0.0–0.2)

## 2020-07-08 LAB — URINALYSIS, ROUTINE W REFLEX MICROSCOPIC
Bilirubin Urine: NEGATIVE
Glucose, UA: NEGATIVE mg/dL
Hgb urine dipstick: NEGATIVE
Ketones, ur: NEGATIVE mg/dL
Leukocytes,Ua: NEGATIVE
Nitrite: NEGATIVE
Protein, ur: NEGATIVE mg/dL
Specific Gravity, Urine: 1.027 (ref 1.005–1.030)
pH: 6 (ref 5.0–8.0)

## 2020-07-08 LAB — CBG MONITORING, ED: Glucose-Capillary: 108 mg/dL — ABNORMAL HIGH (ref 70–99)

## 2020-07-08 MED ORDER — SODIUM CHLORIDE 0.9 % IV BOLUS
1000.0000 mL | Freq: Once | INTRAVENOUS | Status: AC
  Administered 2020-07-08: 1000 mL via INTRAVENOUS

## 2020-07-08 NOTE — ED Provider Notes (Signed)
[facility] Provider Note   CSN: [id number] Arrival date & time: 07/08/20  1919     History Chief Complaint  Patient presents with  . Dizziness    Samuel Greer is a 30 y.o. male.  HPI 30 year old male presents with headache headedness.  Woke up with a headache this morning around 7 AM.  Gradually got worse though at this point it is resolved.  At its max it got to about a 7.  It was diffuse.  However he is also having intermittent lightheadedness seems to be worse when he gets up to do something.  This has also been present since this morning.  No fevers, vision changes, neck pain.  No weakness or numbness.  His symptoms have improved throughout the day but his boss told him he should get checked out.  Past Medical History:  Diagnosis Date  . Back pain   . Hypertension   . Migraine     There are no problems to display for this patient.   Past Surgical History:  Procedure Laterality Date  . HIP SURGERY    . SHOULDER SURGERY     left       Family History  Problem Relation Age of Onset  . Cancer Other   . Diabetes Other   . Diabetes Mother   . Hypertension Mother     Social History   Tobacco Use  . Smoking status: Current Every Day Smoker    Packs/day: 0.15    Types: Cigarettes  . Smokeless tobacco: Never Used  Vaping Use  . Vaping Use: Never used  Substance Use Topics  . Alcohol use: Yes    Comment: social  . Drug use: Yes    Types: Marijuana    Home Medications Prior to Admission medications   Medication Sig Start Date End Date Taking? Authorizing Provider  albuterol (PROVENTIL HFA;VENTOLIN HFA) 108 (90 Base) MCG/ACT inhaler Inhale 2 puffs into the lungs every 6 (six) hours as needed for wheezing or shortness of breath.    [provider]  cyclobenzaprine (FLEXERIL) 10 MG tablet Take 1 tablet (10 mg total) by mouth 3 (three) times daily as needed for muscle spasms. 08/29/19   Riki Sheer, PA-C   HYDROcodone-acetaminophen (NORCO/VICODIN) 5-325 MG tablet Take 2 tablets by mouth every 4 (four) hours as needed. 04/03/19   Walisiewicz, Caroleen Hamman, PA-C  predniSONE (STERAPRED UNI-PAK 21 TAB) 10 MG (21) TBPK tablet Take by mouth daily. Take 6 tabs by mouth daily  for 2 days, then 5 tabs for 2 days, then 4 tabs for 2 days, then 3 tabs for 2 days, 2 tabs for 2 days, then 1 tab by mouth daily for 2 days 08/29/19   Riki Sheer, PA-C    Allergies    Patient has no known allergies.  Review of Systems   Review of Systems  Constitutional: Negative for fever.  Eyes: Negative for visual disturbance.  Gastrointestinal: Negative for vomiting.  Musculoskeletal: Negative for neck pain.  Neurological: Positive for light-headedness and headaches. Negative for dizziness, syncope, weakness and numbness.  All other systems reviewed and are negative.   Physical Exam Updated Vital Signs BP 133/73   Pulse 85   Temp 98.8 F (37.1 C) (Oral)   Resp 17   Ht 5\' 6"  (1.676 m)   Wt (!) 149.7 kg   SpO2 98%   BMI 53.26 kg/m   Physical Exam Vitals and nursing note reviewed.  Constitutional:  Appearance: He is well-developed. He is obese.  HENT:     Head: Normocephalic and atraumatic.     Right Ear: External ear normal.     Left Ear: External ear normal.     Nose: Nose normal.  Eyes:     General:        Right eye: No discharge.        Left eye: No discharge.     Extraocular Movements: Extraocular movements intact.     Pupils: Pupils are equal, round, and reactive to light.  Cardiovascular:     Rate and Rhythm: Normal rate and regular rhythm.     Heart sounds: Normal heart sounds.  Pulmonary:     Effort: Pulmonary effort is normal.     Breath sounds: Normal breath sounds.  Abdominal:     Palpations: Abdomen is soft.     Tenderness: There is no abdominal tenderness.  Musculoskeletal:     Cervical back: Normal range of motion and neck supple. No rigidity.  Skin:    General: Skin is  warm and dry.  Neurological:     Mental Status: He is alert.     Comments: CN 3-12 grossly intact. 5/5 strength in all 4 extremities. Grossly normal sensation. Normal finger to nose.   Psychiatric:        Mood and Affect: Mood is not anxious.     ED Results / Procedures / Treatments   Labs (all labs ordered are listed, but only abnormal results are displayed) Labs Reviewed  CBC - Abnormal; Notable for the following components:      Result Value   RBC 6.35 (*)    MCV 68.5 (*)    MCH 21.6 (*)    RDW 18.6 (*)    All other components within normal limits  CBG MONITORING, ED - Abnormal; Notable for the following components:   Glucose-Capillary 108 (*)    All other components within normal limits  URINALYSIS, ROUTINE W REFLEX MICROSCOPIC  BASIC METABOLIC PANEL    EKG EKG Interpretation  Date/Time:  Friday Jul 08 2020 20:30:46 EDT Ventricular Rate:  84 PR Interval:  168 QRS Duration: 104 QT Interval:  380 QTC Calculation: 450 R Axis:   163 Text Interpretation: Sinus rhythm Right axis deviation ST elev, probable normal early repol pattern similar to Aug 2016 Confirmed by Pricilla Loveless [phone]) on 07/08/2020 8:35:35 PM   Radiology No results found.  Procedures Procedures   Medications Ordered in ED Medications  sodium chloride 0.9 % bolus 1,000 mL (0 mLs Intravenous Stopped 07/08/20 2245)    ED Course  I have reviewed the triage vital signs and the nursing notes.  Pertinent labs & imaging results that were available during my care of the patient were reviewed by me and considered in my medical decision making (see chart for details).    MDM Rules/Calculators/A&P                          Patient presents with nonspecific lightheadedness.  He did have a headache earlier but he has headaches similar to this pretty often and his neuro exam is unremarkable.  He is been ambulatory.  He was given some fluids and is feeling better.  Unclear why he is feeling poorly but with  benign lab work and exam I do not think further work-up or imaging is needed.  Will discharge home with return precautions. Final Clinical Impression(s) / ED Diagnoses Final diagnoses:  Lightheadedness  Rx / DC Orders ED Discharge Orders    None       Pricilla Loveless, MD 07/08/20 2259

## 2020-07-08 NOTE — ED Triage Notes (Signed)
Pt reports that he started experiencing a headache and lightheadedness at work today. He states that he felt like his BP was high. Reports a hx of HTN, not medicated. A&Ox4. States that his headache has improved, but he still feels lightheaded and dizzy.

## 2020-07-08 NOTE — Discharge Instructions (Signed)
If you develop continued, recurrent, or worsening headache, fever, neck stiffness, vomiting, blurry or double vision, weakness or numbness in your arms or legs, trouble speaking, or any other new/concerning symptoms then return to the ER for evaluation.  

## 2020-07-08 NOTE — ED Provider Notes (Signed)
Emergency Medicine Provider Triage Evaluation Note  [name] , a 30 y.o. male  was evaluated in triage.  Pt complains of lightheaded.  Review of Systems  Positive: Headache, lightheaded, light sensitivity Negative: Fever, neck stiffness, rash  Physical Exam  BP (!) 156/104 (BP Location: Right Arm)   Pulse 94   Temp 99.2 F (37.3 C) (Oral)   Resp (!) 21   Ht 5\' 6"  (1.676 m)   Wt (!) 149.7 kg   SpO2 99%   BMI 53.26 kg/m  Gen:   Awake, no distress   Resp:  Normal effort  MSK:   Moves extremities without difficulty  Other:    Medical Decision Making  Medically screening exam initiated at 8:09 PM.  Appropriate orders placed.  OSEPH IMBURGIA was informed that the remainder of the evaluation will be completed by another provider, this initial triage assessment does not replace that evaluation, and the importance of remaining in the ED until their evaluation is complete.  Report throbbing headache, light/sound sensitivity, lightheaded while at work today.  No focal neuro deficits on exam, no nuchal rigidity.  No hx of DM. Has hx of HTN and migraine.    [name], PA-C 07/08/20 2010    09/07/20, MD 07/08/20 [phone]

## 2020-11-10 ENCOUNTER — Encounter (HOSPITAL_COMMUNITY): Payer: Self-pay

## 2020-11-10 ENCOUNTER — Other Ambulatory Visit: Payer: Self-pay

## 2020-11-10 ENCOUNTER — Emergency Department (HOSPITAL_COMMUNITY)
Admission: EM | Admit: 2020-11-10 | Discharge: 2020-11-11 | Disposition: A | Discharge status: Home / Self Care | Payer: Self-pay | Attending: Emergency Medicine | Admitting: Emergency Medicine

## 2020-11-10 DIAGNOSIS — M25562 Pain in left knee: Secondary | ICD-10-CM | POA: Insufficient documentation

## 2020-11-10 DIAGNOSIS — Z5321 Procedure and treatment not carried out due to patient leaving prior to being seen by health care provider: Secondary | ICD-10-CM | POA: Insufficient documentation

## 2020-11-10 DIAGNOSIS — M25561 Pain in right knee: Secondary | ICD-10-CM | POA: Insufficient documentation

## 2020-11-10 NOTE — ED Triage Notes (Signed)
Bilateral knee pain x2 weeks. Advil with improvement. Worse after work.

## 2020-11-11 ENCOUNTER — Emergency Department (HOSPITAL_COMMUNITY)
Admission: EM | Admit: 2020-11-11 | Discharge: 2020-11-11 | Disposition: A | Discharge status: Home / Self Care | Payer: Self-pay | Attending: Emergency Medicine | Admitting: Emergency Medicine

## 2020-11-11 ENCOUNTER — Emergency Department (HOSPITAL_COMMUNITY): Payer: Self-pay

## 2020-11-11 ENCOUNTER — Encounter (HOSPITAL_COMMUNITY): Payer: Self-pay

## 2020-11-11 DIAGNOSIS — F17210 Nicotine dependence, cigarettes, uncomplicated: Secondary | ICD-10-CM | POA: Insufficient documentation

## 2020-11-11 DIAGNOSIS — M25561 Pain in right knee: Secondary | ICD-10-CM

## 2020-11-11 DIAGNOSIS — M25562 Pain in left knee: Secondary | ICD-10-CM | POA: Insufficient documentation

## 2020-11-11 DIAGNOSIS — I10 Essential (primary) hypertension: Secondary | ICD-10-CM | POA: Insufficient documentation

## 2020-11-11 NOTE — ED Notes (Signed)
Called 3x for room placement. Eloped.  

## 2020-11-11 NOTE — ED Triage Notes (Signed)
PT c/o bilateral knee pain for the past several days. Believes work is a contributing factor.

## 2020-11-11 NOTE — Discharge Instructions (Addendum)
Your xrays did not show any signs of fractures or other abnormalities  Please follow up with Dr. Roda Shutters orthopedist for further evaluation.   Continue taking OTC medications including Ibuprofen and Tylenol as needed for pain. You can also buy OTC Voltaren gel to apply to your knees  Return to the ED for any new/worsening symptoms

## 2020-11-11 NOTE — ED Provider Notes (Signed)
[facility] Provider Note   CSN: [id number] Arrival date & time: 11/11/20  1545     History Chief Complaint  Patient presents with   Knee Pain    Samuel Greer is a 30 y.o. male who presents to the ED today with complaint of gradual onset, constant, achy, bilateral knee pain for the past 2 weeks.  Patient reports the pain started in his right knee and has since started in his left.  He does report he started a new job about 1 month ago.  He states he is on his feet quite a lot and moves around quite a lot working at the [employer].  He states that when he moves he will feel a clicking/popping sensation in his right knee.  He states that he has generalized stiffness in his knees mostly after prolonged sitting or laying down.  He states that after walking for short period of time the stiffness will resolve however he will then have some popping.  He has taken over-the-counter medications without relief prompting ED visit today.  He denies any fevers, chills, redness, swelling, any other associated symptoms.  The history is provided by the patient and medical records.      Past Medical History:  Diagnosis Date   Back pain    Hypertension    Migraine     There are no problems to display for this patient.   Past Surgical History:  Procedure Laterality Date   HIP SURGERY     SHOULDER SURGERY     left       Family History  Problem Relation Age of Onset   Cancer Other    Diabetes Other    Diabetes Mother    Hypertension Mother     Social History   Tobacco Use   Smoking status: Every Day    Packs/day: 0.15    Types: Cigarettes   Smokeless tobacco: Never  Vaping Use   Vaping Use: Never used  Substance Use Topics   Alcohol use: Yes    Comment: social   Drug use: Yes    Types: Marijuana    Home Medications Prior to Admission medications   Medication Sig Start Date End Date Taking? Authorizing Provider  albuterol  (PROVENTIL HFA;VENTOLIN HFA) 108 (90 Base) MCG/ACT inhaler Inhale 2 puffs into the lungs every 6 (six) hours as needed for wheezing or shortness of breath.    [provider]  cyclobenzaprine (FLEXERIL) 10 MG tablet Take 1 tablet (10 mg total) by mouth 3 (three) times daily as needed for muscle spasms. 08/29/19   Riki Sheer, PA-C  HYDROcodone-acetaminophen (NORCO/VICODIN) 5-325 MG tablet Take 2 tablets by mouth every 4 (four) hours as needed. 04/03/19   Walisiewicz, Caroleen Hamman, PA-C  predniSONE (STERAPRED UNI-PAK 21 TAB) 10 MG (21) TBPK tablet Take by mouth daily. Take 6 tabs by mouth daily  for 2 days, then 5 tabs for 2 days, then 4 tabs for 2 days, then 3 tabs for 2 days, 2 tabs for 2 days, then 1 tab by mouth daily for 2 days 08/29/19   Riki Sheer, PA-C    Allergies    Patient has no known allergies.  Review of Systems   Review of Systems  Constitutional:  Negative for chills and fever.  Musculoskeletal:  Positive for arthralgias.  Skin:  Negative for color change.  All other systems reviewed and are negative.  Physical Exam Updated Vital Signs BP (!) 156/86 (BP Location:  Left Arm)   Pulse 83   Temp 99.1 F (37.3 C) (Oral)   Resp 16   Ht 5\' 6"  (1.676 m)   Wt (!) 145.2 kg   SpO2 100%   BMI 51.65 kg/m   Physical Exam Vitals and nursing note reviewed.  Constitutional:      Appearance: He is obese. He is not ill-appearing or diaphoretic.  HENT:     Head: Normocephalic and atraumatic.  Eyes:     Conjunctiva/sclera: Conjunctivae normal.  Cardiovascular:     Rate and Rhythm: Normal rate and regular rhythm.  Pulmonary:     Effort: Pulmonary effort is normal.     Breath sounds: Normal breath sounds.  Musculoskeletal:     Comments: No obvious swelling, erythema, or increased warmth to the bilateral knee. ROM intact bilaterally however crepitus noted to R knee with flexion/extension. Mild TTP to bilateral knees. Strength and sensation intact. 2+ PT pulses  bilaterally.   Skin:    General: Skin is warm and dry.     Coloration: Skin is not jaundiced.  Neurological:     Mental Status: He is alert.    ED Results / Procedures / Treatments   Labs (all labs ordered are listed, but only abnormal results are displayed) Labs Reviewed - No data to display  EKG None  Radiology DG Knee Complete 4 Views Left  Result Date: 11/11/2020 CLINICAL DATA:  Bilateral knee pain for several days EXAM: RIGHT KNEE - COMPLETE 4+ VIEW; LEFT KNEE - COMPLETE 4+ VIEW COMPARISON:  None. FINDINGS: Right knee: Frontal, bilateral oblique, lateral views are obtained. No fracture, subluxation, or dislocation. Joint spaces are well preserved. No joint effusion. 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 Left knee: Frontal, bilateral oblique, lateral views are obtained. No fracture, subluxation, or dislocation. Joint spaces are well preserved. No joint effusion. IMPRESSION: 1. Unremarkable bilateral knees.  No acute bony abnormalities. Electronically Signed   By: 01/11/2021 M.D.   On: 11/11/2020 19:08   DG Knee Complete 4 Views Right  Result Date: 11/11/2020 CLINICAL DATA:  Bilateral knee pain for several days EXAM: RIGHT KNEE - COMPLETE 4+ VIEW; LEFT KNEE - COMPLETE 4+ VIEW COMPARISON:  None. FINDINGS: Right knee: Frontal, bilateral oblique, lateral views are obtained. No fracture, subluxation, or dislocation. Joint spaces are well preserved. No joint effusion. 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 Left knee: Frontal, bilateral oblique, lateral views are obtained. No fracture, subluxation, or dislocation. Joint spaces are well preserved. No joint effusion. IMPRESSION: 1. Unremarkable bilateral knees.  No acute bony abnormalities. Electronically Signed   By: 01/11/2021 M.D.   On: 11/11/2020 19:08     Procedures Procedures   Medications Ordered in ED Medications - No data to display  ED Course  I have reviewed the triage vital signs and the nursing notes.  Pertinent labs & imaging results that were available during my care of the patient were reviewed by me and considered in my medical decision making (see chart for details).    MDM Rules/Calculators/A&P                           30 year old male who presents to the ED today with complaint of lateral knee pain for the past 2 weeks after starting new job 1 month ago.  On arrival to the ED vitals are stable  patient appears to be no acute distress.  On my exam he has some bilateral tenderness palpation to the knees.  He has no overlying skin changes to suggest gout, septic arthritis.  He has crepitus with range of motion to the right knee.  He is neurovascularly intact throughout.  He is obese.  I suspect that he likely has some arthritis in his joints.  We will plan for x-rays bilaterally.  Patient may need Ortho follow-up for likely corticosteroid injections.  Xrays negative. Will discharge home at this time with ortho follow up.   This note was prepared using Dragon voice recognition software and may include unintentional dictation errors due to the inherent limitations of voice recognition software.   Final Clinical Impression(s) / ED Diagnoses Final diagnoses:  Acute pain of both knees    Rx / DC Orders ED Discharge Orders     None        Discharge Instructions      Your xrays did not show any signs of fractures or other abnormalities  Please follow up with Dr. Roda Shutters orthopedist for further evaluation.   Continue taking OTC medications including Ibuprofen and Tylenol as needed for pain. You can also buy OTC Voltaren gel to apply to your knees  Return to the ED for any new/worsening symptoms       Tanda Rockers, Cordelia Poche 11/11/20 1927    Lorre Nick, MD 11/15/20 1319

## 2021-05-05 ENCOUNTER — Ambulatory Visit (HOSPITAL_COMMUNITY)
Admission: EM | Admit: 2021-05-05 | Discharge: 2021-05-05 | Disposition: A | Discharge status: Home / Self Care | Payer: Self-pay

## 2021-05-05 ENCOUNTER — Other Ambulatory Visit: Payer: Self-pay

## 2021-05-05 ENCOUNTER — Encounter (HOSPITAL_COMMUNITY): Payer: Self-pay

## 2021-05-05 DIAGNOSIS — I10 Essential (primary) hypertension: Secondary | ICD-10-CM

## 2021-05-05 NOTE — Discharge Instructions (Addendum)
Make an appointment with your primary care as soon as possible. ?Do not donate plasma until you have been seen by your primary care and your blood pressure has been addressed. ?May return to work with no restrictions based on the blood pressure here in the clinic. ?Follow-up in the emergency department if you develop chest pain, shortness of breath, difficulty breathing, or blurred vision with elevated blood pressure or other concerns. ?Follow-up as needed. ?

## 2021-05-05 NOTE — ED Triage Notes (Signed)
Pt was told his BP has been running high after attempting to donate plasma  ?

## 2021-05-05 NOTE — ED Provider Notes (Signed)
?[facility] ? ? ? ?CSN: [id number] ?Arrival date & time: 05/05/21  1854 ? ? ?  ? ?History   ?Chief Complaint ?Chief Complaint  ?Patient presents with  ? Hypertension  ? ? ?HPI ?Samuel Greer is a 31 y.o. male.  ? ?The patient is a 31 year old male who presents for elevated blood pressure readings.  Patient states his blood pressure has been running high over the past couple of days after he has been trying to donate plasma.  States his last blood pressure approximately 2 days ago was 160s/100s.  He states he woke up with a mild headache this morning but that went away.  He also has a history of migraines.  He denies chest pain, shortness of breath, difficulty breathing, lower extremity edema, blurred vision.  He denies any other past medical history such as high cholesterol, diabetes, thyroid disease, or sleep apnea.  The patient is obese, and it was recommended by his employer to follow-up to see when he could return to work. ? ? ?Hypertension ? ? ?Past Medical History:  ?Diagnosis Date  ? Back pain   ? Hypertension   ? Migraine   ? ? ?There are no problems to display for this patient. ? ? ?Past Surgical History:  ?Procedure Laterality Date  ? HIP SURGERY    ? SHOULDER SURGERY    ? left  ? ? ? ? ? ?Home Medications   ? ?Prior to Admission medications   ?Medication Sig Start Date End Date Taking? Authorizing Provider  ?albuterol (PROVENTIL HFA;VENTOLIN HFA) 108 (90 Base) MCG/ACT inhaler Inhale 2 puffs into the lungs every 6 (six) hours as needed for wheezing or shortness of breath.    [provider]  ?cyclobenzaprine (FLEXERIL) 10 MG tablet Take 1 tablet (10 mg total) by mouth 3 (three) times daily as needed for muscle spasms. 08/29/19   Riki Sheer, PA-C  ?HYDROcodone-acetaminophen (NORCO/VICODIN) 5-325 MG tablet Take 2 tablets by mouth every 4 (four) hours as needed. 04/03/19   Walisiewicz, Yvonna Alanis E, PA-C  ?predniSONE (STERAPRED UNI-PAK 21 TAB) 10 MG (21) TBPK tablet Take by mouth daily.  Take 6 tabs by mouth daily  for 2 days, then 5 tabs for 2 days, then 4 tabs for 2 days, then 3 tabs for 2 days, 2 tabs for 2 days, then 1 tab by mouth daily for 2 days 08/29/19   Riki Sheer, PA-C  ? ? ?Family History ?Family History  ?Problem Relation Age of Onset  ? Cancer Other   ? Diabetes Other   ? Diabetes Mother   ? Hypertension Mother   ? ? ?Social History ?Social History  ? ?Tobacco Use  ? Smoking status: Every Day  ?  Packs/day: 0.15  ?  Types: Cigarettes  ? Smokeless tobacco: Never  ?Vaping Use  ? Vaping Use: Never used  ?Substance Use Topics  ? Alcohol use: Yes  ?  Comment: social  ? Drug use: Yes  ?  Types: Marijuana  ? ? ? ?Allergies   ?Patient has no known allergies. ? ? ?Review of Systems ?Review of Systems  ?Constitutional: Negative.   ?Respiratory: Negative.    ?Cardiovascular: Negative.   ?Gastrointestinal: Negative.   ?Skin: Negative.   ?Psychiatric/Behavioral: Negative.    ? ? ?Physical Exam ?Triage Vital Signs ?ED Triage Vitals  ?Enc Vitals Group  ?   BP 05/05/21 2007 (!) 139/98  ?   Pulse Rate 05/05/21 2007 85  ?   Resp 05/05/21 2007 18  ?  Temp 05/05/21 2007 98.7 ?F (37.1 ?C)  ?   Temp src --   ?   SpO2 05/05/21 2007 98 %  ?   Weight --   ?   Height --   ?   Head Circumference --   ?   Peak Flow --   ?   Pain Score 05/05/21 2006 0  ?   Pain Loc --   ?   Pain Edu? --   ?   Excl. in GC? --   ? ?No data found. ? ?Updated Vital Signs ?BP (!) 139/98   Pulse 85   Temp 98.7 ?F (37.1 ?C)   Resp 18   SpO2 98%  ? ?Visual Acuity ?Right Eye Distance:   ?Left Eye Distance:   ?Bilateral Distance:   ? ?Right Eye Near:   ?Left Eye Near:    ?Bilateral Near:    ? ?Physical Exam ?Constitutional:   ?   General: He is not in acute distress. ?   Appearance: He is obese.  ?HENT:  ?   Head: Normocephalic and atraumatic.  ?Eyes:  ?   Extraocular Movements: Extraocular movements intact.  ?   Pupils: Pupils are equal, round, and reactive to light.  ?Cardiovascular:  ?   Rate and Rhythm: Normal rate and  regular rhythm.  ?Pulmonary:  ?   Effort: Pulmonary effort is normal.  ?   Breath sounds: Normal breath sounds.  ?Abdominal:  ?   General: Bowel sounds are normal.  ?   Palpations: Abdomen is soft.  ?Musculoskeletal:  ?   Cervical back: Normal range of motion and neck supple.  ?Skin: ?   General: Skin is warm and dry.  ?Neurological:  ?   General: No focal deficit present.  ?   Mental Status: He is alert and oriented to person, place, and time.  ?Psychiatric:     ?   Mood and Affect: Mood normal.     ?   Behavior: Behavior normal.  ? ? ? ?UC Treatments / Results  ?Labs ?(all labs ordered are listed, but only abnormal results are displayed) ?Labs Reviewed - No data to display ? ?EKG ? ? ?Radiology ?No results found. ? ?Procedures ?Procedures (including critical care time) ? ?Medications Ordered in UC ?Medications - No data to display ? ?Initial Impression / Assessment and Plan / UC Course  ?I have reviewed the triage vital signs and the nursing notes. ? ?Pertinent labs & imaging results that were available during my care of the patient were reviewed by me and considered in my medical decision making (see chart for details). ? ?Patient reports that he does not have a history of hypertension; however, it is noted in his past medical history.  Patient also denies any other comorbidities at this time.  Based on the patient's weight, and blood pressure, I recommended that he follow-up with the primary care so he could have a physical exam and recommended lab work to include a metabolic panel, lipid panel, A1c, and a TSH at a minimum.  Patient would also benefit from a sleep study which may also be impacting his blood pressure.  Patient was told he could go back to work as I do not feel it is appropriate for me to take him out of work, but I did recommend that he does not donate plasma until he is seen by his primary care physician.  He is planning on establishing care with his grandmothers doctor as she is familiar with  his family.  Patient was also encouraged to begin keeping a journal of his blood pressures, he could go to the local pharmacies for blood pressure checks or he could purchase a blood pressure monitor for his home use.  Patient was encouraged to begin lifestyle changes such as incorporating exercise, and making dietary changes as the for steps towards getting his blood pressure under control.  Patient was encouraged to go to the emergency department if he developed chest pain, shortness of breath, difficulty breathing, or blurred vision if his blood pressure is elevated in the interim. ?Final Clinical Impressions(s) / UC Diagnoses  ? ?Final diagnoses:  ?None  ? ?Discharge Instructions   ?None ?  ? ?ED Prescriptions   ?None ?  ? ?PDMP not reviewed this encounter. ?  ?Abran Cantor, NP ?05/05/21 2051 ? ?

## 2021-05-17 ENCOUNTER — Other Ambulatory Visit: Payer: Self-pay

## 2021-05-17 ENCOUNTER — Encounter (HOSPITAL_COMMUNITY): Payer: Self-pay | Admitting: Emergency Medicine

## 2021-05-17 ENCOUNTER — Emergency Department (HOSPITAL_COMMUNITY)
Admission: EM | Admit: 2021-05-17 | Discharge: 2021-05-17 | Disposition: A | Discharge status: Home / Self Care | Payer: 59 | Attending: Emergency Medicine | Admitting: Emergency Medicine

## 2021-05-17 DIAGNOSIS — H6692 Otitis media, unspecified, left ear: Secondary | ICD-10-CM | POA: Diagnosis not present

## 2021-05-17 DIAGNOSIS — H9202 Otalgia, left ear: Secondary | ICD-10-CM | POA: Diagnosis present

## 2021-05-17 DIAGNOSIS — H6690 Otitis media, unspecified, unspecified ear: Secondary | ICD-10-CM

## 2021-05-17 MED ORDER — AMOXICILLIN 500 MG PO CAPS: 500.0000 mg | ORAL_CAPSULE | Freq: Three times a day (TID) | ORAL | 0 refills | Status: DC

## 2021-05-17 NOTE — ED Triage Notes (Signed)
Pt reports bilateral ear fullness and can feel his pulse, denies pain. Has some nasal congestion. Pinna and tragus non-tender to movement. TM intact bilaterally.  ?

## 2021-05-17 NOTE — Discharge Instructions (Addendum)
You likely have an ear infection on the left.  I have sent in antibiotics to the pharmacy for you.  If you have worsening symptoms please return to the emergency room.  Your blood pressure was also elevated today.  You mentioned you do not currently have a primary care provider.  I have attached information for [facility].  You can call and establish care with them.  Recommend you keep a blood pressure diary in the meantime. ?

## 2021-05-17 NOTE — ED Provider Notes (Signed)
?[facility] ?Provider Note ? ? ?CSN: [id number] ?Arrival date & time: 05/17/21  1430 ? ?  ? ?History ? ?Chief Complaint  ?Patient presents with  ? Ear Fullness  ? ? ?Samuel Greer is a 31 y.o. male. ? ?31 year old male presents today for evaluation of left ear pain.  He denies fever, chills.  Reported recent upper respiratory infection.  Stated 2 days ago he had pain in his right ear and he used hydrogen peroxide with resolution of pain in the right ear and over the past day he has been out of his left ear.  Denies any drainage from his ear.  Currently denies sinus congestion, postnasal drainage.  Denies changes in his hearing. ? ?The history is provided by the patient. No language interpreter was used.  ? ?  ? ?Home Medications ?Prior to Admission medications   ?Medication Sig Start Date End Date Taking? Authorizing Provider  ?albuterol (PROVENTIL HFA;VENTOLIN HFA) 108 (90 Base) MCG/ACT inhaler Inhale 2 puffs into the lungs every 6 (six) hours as needed for wheezing or shortness of breath.    [provider]  ?cyclobenzaprine (FLEXERIL) 10 MG tablet Take 1 tablet (10 mg total) by mouth 3 (three) times daily as needed for muscle spasms. 08/29/19   Bjorn Pippin, PA-C  ?HYDROcodone-acetaminophen (NORCO/VICODIN) 5-325 MG tablet Take 2 tablets by mouth every 4 (four) hours as needed. 04/03/19   Walisiewicz, Verline Lema E, PA-C  ?predniSONE (STERAPRED UNI-PAK 21 TAB) 10 MG (21) TBPK tablet Take by mouth daily. Take 6 tabs by mouth daily  for 2 days, then 5 tabs for 2 days, then 4 tabs for 2 days, then 3 tabs for 2 days, 2 tabs for 2 days, then 1 tab by mouth daily for 2 days 08/29/19   Bjorn Pippin, PA-C  ?   ? ?Allergies    ?Patient has no known allergies.   ? ?Review of Systems   ?Review of Systems  ?Constitutional:  Negative for chills and fever.  ?HENT:  Negative for congestion, postnasal drip and sore throat.   ?Respiratory:  Negative for cough and shortness of breath.    ?Neurological:  Negative for headaches.  ?All other systems reviewed and are negative. ? ?Physical Exam ?Updated Vital Signs ?BP (!) 169/101 (BP Location: Left Arm)   Pulse 75   Temp 98.1 ?F (36.7 ?C) (Oral)   Resp 16   Ht 5\' 6"  (1.676 m)   Wt (!) 148.3 kg   SpO2 95%   BMI 52.78 kg/m?  ?Physical Exam ?Vitals and nursing note reviewed.  ?Constitutional:   ?   General: He is not in acute distress. ?   Appearance: Normal appearance. He is not ill-appearing.  ?HENT:  ?   Head: Normocephalic and atraumatic.  ?   Right Ear: Tympanic membrane, ear canal and external ear normal. There is no impacted cerumen. No mastoid tenderness. Tympanic membrane is not erythematous or bulging.  ?   Left Ear: No mastoid tenderness. Tympanic membrane is erythematous. Tympanic membrane is not injected, perforated or bulging.  ?   Nose: Nose normal.  ?Eyes:  ?   Conjunctiva/sclera: Conjunctivae normal.  ?Pulmonary:  ?   Effort: Pulmonary effort is normal. No respiratory distress.  ?Musculoskeletal:     ?   General: No deformity.  ?Skin: ?   Findings: No rash.  ?Neurological:  ?   Mental Status: He is alert.  ? ? ?ED Results / Procedures / Treatments   ?Labs ?(all  labs ordered are listed, but only abnormal results are displayed) ?Labs Reviewed - No data to display ? ?EKG ?None ? ?Radiology ?No results found. ? ?Procedures ?Procedures  ? ? ?Medications Ordered in ED ?Medications - No data to display ? ?ED Course/ Medical Decision Making/ A&P ?  ?                        ?Medical Decision Making ? ?31 year old male presents today for evaluation of left ear pain of 2-day duration.  Patient recently had viral upper respiratory infection and up until 2 days ago also has sinus congestion.  He had similar symptoms in the right ear that improved after using hydrogen peroxide.  He is without fever, chills, loss of hearing.  On exam he does have erythematous TM on the left.  We will treat with amoxicillin.  Return precautions discussed.   Patient voices understanding and is in agreement with plan. ? ? ?Final Clinical Impression(s) / ED Diagnoses ?Final diagnoses:  ?Acute otitis media, unspecified otitis media type  ? ? ?Rx / DC Orders ?ED Discharge Orders   ? ?      Ordered  ?  amoxicillin (AMOXIL) 500 MG capsule  3 times daily       ? 05/17/21 1458  ? ?  ?  ? ?  ? ? ?  ?Evlyn Courier, PA-C ?05/17/21 1500 ? ?  ?Deno Etienne, DO ?05/17/21 1504 ? ?

## 2021-05-29 ENCOUNTER — Emergency Department (HOSPITAL_COMMUNITY)
Admission: EM | Admit: 2021-05-29 | Discharge: 2021-05-29 | Disposition: A | Discharge status: Home / Self Care | Payer: 59 | Attending: Emergency Medicine | Admitting: Emergency Medicine

## 2021-05-29 DIAGNOSIS — H6693 Otitis media, unspecified, bilateral: Secondary | ICD-10-CM | POA: Diagnosis not present

## 2021-05-29 DIAGNOSIS — H6690 Otitis media, unspecified, unspecified ear: Secondary | ICD-10-CM

## 2021-05-29 DIAGNOSIS — H9203 Otalgia, bilateral: Secondary | ICD-10-CM | POA: Diagnosis present

## 2021-05-29 DIAGNOSIS — I10 Essential (primary) hypertension: Secondary | ICD-10-CM | POA: Insufficient documentation

## 2021-05-29 MED ORDER — CETIRIZINE HCL 10 MG PO TABS: 10.0000 mg | ORAL_TABLET | Freq: Every day | ORAL | 0 refills | Status: DC

## 2021-05-29 MED ORDER — FLUTICASONE PROPIONATE 50 MCG/ACT NA SUSP: 1.0000 | Freq: Every day | NASAL | 2 refills | Status: DC

## 2021-05-29 MED ORDER — CEFDINIR 300 MG PO CAPS: 300.0000 mg | ORAL_CAPSULE | Freq: Two times a day (BID) | ORAL | 0 refills | Status: AC

## 2021-05-29 NOTE — ED Triage Notes (Signed)
Patient reports bilateral ear pain and muffled sensation. He was seen for the same complaint on 3/15 and prescribed abx. He reports he did not get this filled and took abx from home instead. He states he did not finish the entire prescription.  ?

## 2021-05-29 NOTE — Discharge Instructions (Addendum)
Please pick up new antibiotic prescription and take as prescribed. Discontinue the amoxicillin you have been taking.  ? ?Use the flonase and zyrtec as prescribed to help with fluid behind your ears.  ? ?Return to the ED for any new/worsening symptoms ? ?Follow up with a PCP for further evaluation of your elevated blood pressure readings.  ?

## 2021-05-29 NOTE — ED Provider Notes (Signed)
?[facility]-EMERGENCY DEPT ?Provider Note ? ? ?CSN: [id number] ?Arrival date & time: 05/29/21  1248 ? ?  ? ?History ? ?Chief Complaint  ?Patient presents with  ? Otalgia  ? ? ?Samuel Greer is a 31 y.o. male who presents to the ED today with complaint of gradual onset, constant, achy, bilateral ear pain for the past 2 weeks.  Per chart review patient was seen in the ED on 03/15 with complaint of left ear pain.  He was diagnosed with acute otitis media and discharged home with amoxicillin 500 mg 2 times daily.  Patient does admit that he never picked up this prescription.  He states that he had amoxicillin at the house, unsure what dosage and has been taking it twice a day.  States he has not had any improvement in his symptoms and for the past 2 days has begun to have right ear pain.  He also complains of muffled hearing bilaterally.  Denies any fevers, chills, body aches, sore throat, cough, ear discharge, any other associated symptoms.  ? ?The history is provided by the patient and medical records.  ? ?  ? ?Home Medications ?Prior to Admission medications   ?Medication Sig Start Date End Date Taking? Authorizing Provider  ?cefdinir (OMNICEF) 300 MG capsule Take 1 capsule (300 mg total) by mouth 2 (two) times daily for 7 days. 05/29/21 06/05/21 Yes Tymika Grilli, PA-C  ?cetirizine (ZYRTEC ALLERGY) 10 MG tablet Take 1 tablet (10 mg total) by mouth daily. 05/29/21 06/28/21 Yes Ryo Klang, PA-C  ?fluticasone (FLONASE) 50 MCG/ACT nasal spray Place 1 spray into both nostrils daily. 05/29/21  Yes Yobana Culliton, PA-C  ?albuterol (PROVENTIL HFA;VENTOLIN HFA) 108 (90 Base) MCG/ACT inhaler Inhale 2 puffs into the lungs every 6 (six) hours as needed for wheezing or shortness of breath.    [provider]  ?amoxicillin (AMOXIL) 500 MG capsule Take 1 capsule (500 mg total) by mouth 3 (three) times daily. 05/17/21   Marita Kansas, PA-C  ?cyclobenzaprine (FLEXERIL) 10 MG tablet Take 1 tablet (10 mg  total) by mouth 3 (three) times daily as needed for muscle spasms. 08/29/19   Riki Sheer, PA-C  ?HYDROcodone-acetaminophen (NORCO/VICODIN) 5-325 MG tablet Take 2 tablets by mouth every 4 (four) hours as needed. 04/03/19   Walisiewicz, Yvonna Alanis E, PA-C  ?predniSONE (STERAPRED UNI-PAK 21 TAB) 10 MG (21) TBPK tablet Take by mouth daily. Take 6 tabs by mouth daily  for 2 days, then 5 tabs for 2 days, then 4 tabs for 2 days, then 3 tabs for 2 days, 2 tabs for 2 days, then 1 tab by mouth daily for 2 days 08/29/19   Riki Sheer, PA-C  ?   ? ?Allergies    ?Patient has no known allergies.   ? ?Review of Systems   ?Review of Systems  ?Constitutional:  Negative for chills and fever.  ?HENT:  Positive for ear pain and hearing loss. Negative for ear discharge, sore throat, trouble swallowing and voice change.   ?All other systems reviewed and are negative. ? ?Physical Exam ?Updated Vital Signs ?BP (!) 157/115 (BP Location: Right Arm)   Pulse 86   Temp 99.1 ?F (37.3 ?C) (Oral)   Resp 18   Ht 5\' 6"  (1.676 m)   Wt (!) 147.9 kg   SpO2 98%   BMI 52.62 kg/m?  ?Physical Exam ?Vitals and nursing note reviewed.  ?Constitutional:   ?   Appearance: He is not ill-appearing.  ?HENT:  ?  Head: Normocephalic and atraumatic.  ?   Right Ear: No mastoid tenderness. Tympanic membrane is bulging.  ?   Left Ear: No mastoid tenderness. Tympanic membrane is erythematous.  ?Eyes:  ?   Conjunctiva/sclera: Conjunctivae normal.  ?Cardiovascular:  ?   Rate and Rhythm: Normal rate and regular rhythm.  ?Pulmonary:  ?   Effort: Pulmonary effort is normal.  ?   Breath sounds: Normal breath sounds.  ?Skin: ?   General: Skin is warm and dry.  ?   Coloration: Skin is not jaundiced.  ?Neurological:  ?   Mental Status: He is alert.  ? ? ?ED Results / Procedures / Treatments   ?Labs ?(all labs ordered are listed, but only abnormal results are displayed) ?Labs Reviewed - No data to display ? ?EKG ?None ? ?Radiology ?No results  found. ? ?Procedures ?Procedures  ? ? ?Medications Ordered in ED ?Medications - No data to display ? ?ED Course/ Medical Decision Making/ A&P ?  ?                        ?Medical Decision Making ?31 year old male who presents to the ED today with persistent left ear pain, has been taking amoxicillin, unknown dosage, twice a day for the past 1.5 weeks without improvement.  He currently having right-sided ear pain as well x2 days.  On arrival to the ED patient's temperature is 99.1.  His blood pressure is elevated initially 157/115 with highest 186 systolic.  Not currently on anything for blood pressure.  Reports he is in the process of establishing care with PCP.  He denies any headache, blurry vision, double vision, chest pain, nausea, vomiting, difficulty urinating.  He has been seen at urgent care for hypertension in the past.  He is noted to have erythematous TM on left side.  He has bulging TM on right side.  We will plan to switch patient to cefdinir at this time.  We will also prescribe Flonase and Zyrtec to help with fluid behind eardrum as I suspect this is causing some muffled hearing.  Patient advised to return to the ED for any new/worsening symptoms.  He is in agreement with plan and stable for discharge.  I do not feel he requires labs or additional work-up for his asymptomatic hypertension at this time.  ? ?Problems Addressed: ?Acute otitis media, unspecified otitis media type: acute illness or injury ? ? ? ? ? ? ? ? ? ?Final Clinical Impression(s) / ED Diagnoses ?Final diagnoses:  ?Acute otitis media, unspecified otitis media type  ?Hypertension, unspecified type  ? ? ?Rx / DC Orders ?ED Discharge Orders   ? ?      Ordered  ?  cefdinir (OMNICEF) 300 MG capsule  2 times daily       ? 05/29/21 1352  ?  fluticasone (FLONASE) 50 MCG/ACT nasal spray  Daily       ? 05/29/21 1352  ?  cetirizine (ZYRTEC ALLERGY) 10 MG tablet  Daily       ? 05/29/21 1352  ? ?  ?  ? ?  ? ? ? ?Discharge Instructions   ? ?   ?Please pick up new antibiotic prescription and take as prescribed. Discontinue the amoxicillin you have been taking.  ? ?Use the flonase and zyrtec as prescribed to help with fluid behind your ears.  ? ?Return to the ED for any new/worsening symptoms ? ?Follow up with a PCP for further evaluation of your elevated  blood pressure readings.  ? ? ? ? ? ?  ?Tanda RockersVenter, Shambria Camerer, PA-C ?05/29/21 1353 ? ?  ?Rolan BuccoBelfi, Melanie, MD ?05/29/21 1442 ? ?

## 2021-09-17 ENCOUNTER — Emergency Department (HOSPITAL_COMMUNITY): Payer: 59

## 2021-09-17 ENCOUNTER — Encounter (HOSPITAL_COMMUNITY): Payer: Self-pay

## 2021-09-17 ENCOUNTER — Observation Stay (HOSPITAL_COMMUNITY)
Admission: EM | Admit: 2021-09-17 | Discharge: 2021-09-19 | Disposition: A | Discharge status: Home / Self Care | Payer: 59 | Attending: Internal Medicine | Admitting: Internal Medicine

## 2021-09-17 ENCOUNTER — Other Ambulatory Visit: Payer: Self-pay

## 2021-09-17 DIAGNOSIS — I319 Disease of pericardium, unspecified: Secondary | ICD-10-CM | POA: Insufficient documentation

## 2021-09-17 DIAGNOSIS — R0602 Shortness of breath: Secondary | ICD-10-CM | POA: Insufficient documentation

## 2021-09-17 DIAGNOSIS — Z20822 Contact with and (suspected) exposure to covid-19: Secondary | ICD-10-CM | POA: Diagnosis not present

## 2021-09-17 DIAGNOSIS — J069 Acute upper respiratory infection, unspecified: Secondary | ICD-10-CM | POA: Insufficient documentation

## 2021-09-17 DIAGNOSIS — Z79899 Other long term (current) drug therapy: Secondary | ICD-10-CM | POA: Diagnosis not present

## 2021-09-17 DIAGNOSIS — R778 Other specified abnormalities of plasma proteins: Secondary | ICD-10-CM | POA: Insufficient documentation

## 2021-09-17 DIAGNOSIS — R7303 Prediabetes: Secondary | ICD-10-CM

## 2021-09-17 DIAGNOSIS — F17210 Nicotine dependence, cigarettes, uncomplicated: Secondary | ICD-10-CM | POA: Diagnosis not present

## 2021-09-17 DIAGNOSIS — R0781 Pleurodynia: Principal | ICD-10-CM | POA: Insufficient documentation

## 2021-09-17 DIAGNOSIS — I10 Essential (primary) hypertension: Secondary | ICD-10-CM | POA: Insufficient documentation

## 2021-09-17 DIAGNOSIS — R079 Chest pain, unspecified: Secondary | ICD-10-CM | POA: Diagnosis present

## 2021-09-17 DIAGNOSIS — R0789 Other chest pain: Secondary | ICD-10-CM

## 2021-09-17 LAB — BASIC METABOLIC PANEL
Anion gap: 11 (ref 5–15)
BUN: 12 mg/dL (ref 6–20)
CO2: 24 mmol/L (ref 22–32)
Calcium: 9.1 mg/dL (ref 8.9–10.3)
Chloride: 104 mmol/L (ref 98–111)
Creatinine, Ser: 0.95 mg/dL (ref 0.61–1.24)
GFR, Estimated: >60 mL/min (ref >60)
Glucose, Bld: 93 mg/dL (ref 70–99)
Potassium: 4.2 mmol/L (ref 3.5–5.1)
Sodium: 139 mmol/L (ref 135–145)

## 2021-09-17 LAB — CBC
HCT: 44.7 % (ref 39.0–52.0)
Hemoglobin: 14.1 g/dL (ref 13.0–17.0)
MCH: 21.2 pg — ABNORMAL LOW (ref 26.0–34.0)
MCHC: 31.5 g/dL (ref 30.0–36.0)
MCV: 67.3 fL — ABNORMAL LOW (ref 80.0–100.0)
Platelets: 220 10*3/uL (ref 150–400)
RBC: 6.64 MIL/uL — ABNORMAL HIGH (ref 4.22–5.81)
RDW: 18.2 % — ABNORMAL HIGH (ref 11.5–15.5)
WBC: 10.1 10*3/uL (ref 4.0–10.5)
nRBC: 0 % (ref 0.0–0.2)

## 2021-09-17 LAB — RAPID URINE DRUG SCREEN, HOSP PERFORMED
Amphetamines: NOT DETECTED
Barbiturates: NOT DETECTED
Benzodiazepines: NOT DETECTED
Cocaine: NOT DETECTED
Opiates: NOT DETECTED
Tetrahydrocannabinol: POSITIVE — AB

## 2021-09-17 LAB — URINALYSIS, ROUTINE W REFLEX MICROSCOPIC
Bacteria, UA: NONE SEEN
Bilirubin Urine: NEGATIVE
Glucose, UA: NEGATIVE mg/dL
Hgb urine dipstick: NEGATIVE
Ketones, ur: NEGATIVE mg/dL
Leukocytes,Ua: NEGATIVE
Nitrite: NEGATIVE
Protein, ur: NEGATIVE mg/dL
Specific Gravity, Urine: 1.038 — ABNORMAL HIGH (ref 1.005–1.030)
pH: 5 (ref 5.0–8.0)

## 2021-09-17 LAB — TROPONIN I (HIGH SENSITIVITY)
Troponin I (High Sensitivity): 304 ng/L (ref <18)
Troponin I (High Sensitivity): 342 ng/L (ref <18)

## 2021-09-17 LAB — SEDIMENTATION RATE: Sed Rate: 11 mm/hr (ref 0–16)

## 2021-09-17 LAB — BRAIN NATRIURETIC PEPTIDE: B Natriuretic Peptide: 57 pg/mL (ref 0.0–100.0)

## 2021-09-17 LAB — RESP PANEL BY RT-PCR (FLU A&B, COVID) ARPGX2
Influenza A by PCR: NEGATIVE
Influenza B by PCR: NEGATIVE
SARS Coronavirus 2 by RT PCR: NEGATIVE

## 2021-09-17 MED ORDER — IOHEXOL 350 MG/ML SOLN
100.0000 mL | Freq: Once | INTRAVENOUS | Status: AC | PRN
  Administered 2021-09-17: 100 mL via INTRAVENOUS

## 2021-09-17 MED ORDER — SODIUM CHLORIDE (PF) 0.9 % IJ SOLN
INTRAMUSCULAR | Status: AC
  Filled 2021-09-17: qty 50

## 2021-09-17 MED ORDER — KETOROLAC TROMETHAMINE 15 MG/ML IJ SOLN: 15.0000 mg | Freq: Once | INTRAMUSCULAR | Status: DC

## 2021-09-17 NOTE — ED Notes (Signed)
LAB will add on CK

## 2021-09-17 NOTE — ED Triage Notes (Signed)
Pt reports increasing SHOB and chest pain since Wednesday. Pt also reports neck soreness. Pt reports that chest pain and SHOB worsen with exertion.

## 2021-09-17 NOTE — ED Provider Triage Note (Signed)
Emergency Medicine Provider Triage Evaluation Note  Samuel Greer , a 31 y.o. male  was evaluated in triage.  Pt complains of chest pain, shortness of breath, nausea worse with exertion, he is also endorsing fever, chills, feeling of weakness for the last 3 days.  Patient reports that he feels like his heart is racing agrees to have a chest pain shortness of breath.  He endorses tobacco use although he is trying to quit, reports history of high blood pressure, denies history of high cholesterol, diabetes.  No previous stroke or ACS.  Patient describes the chest pain as pressure.  He reports that he has had indigestion before but it feels different because there is no relief when he burps.  Has had some neck pain and stiffness for the last several days, reports that he put a heating pad on it last night and is somewhat improved today.  Review of Systems  Positive: Chest pain, shob, nausea, fever, chills Negative: Headache, vision changes  Physical Exam  BP (!) 155/119 (BP Location: Left Arm)   Pulse (!) 108   Temp 100 F (37.8 C) (Oral)   Resp 18   Ht 5\' 6"  (1.676 m)   Wt (!) 149.7 kg   SpO2 98%   BMI 53.26 kg/m  Gen:   Awake, no distress   Resp:  Normal effort  MSK:   Moves extremities without difficulty  Other:  Tachycardic rate, normal rhythm  Medical Decision Making  Medically screening exam initiated at 4:45 PM.  Appropriate orders placed.  Samuel Greer was informed that the remainder of the evaluation will be completed by another provider, this initial triage assessment does not replace that evaluation, and the importance of remaining in the ED until their evaluation is complete.  Workup initiated EKG automatic electronic review shows possible inferior infarct, shown to Dr. Aubery Greer who does not see acute STEMI at this time   Samuel Greer, Samuel Greer 09/17/21 1647

## 2021-09-17 NOTE — ED Provider Notes (Signed)
[facility] DEPT Provider Note   CSN: [id number] Arrival date & time: 09/17/21  1551     History  Chief Complaint  Patient presents with   Shortness of Breath   Chest Pain    Samuel Greer is a 31 y.o. male.  Is here with shortness of breath, chest pain, body aches.  History of smoking.  States that he has had fever and body aches for the last for 5 days.  Denies any sick contact.  Denies any headaches.  Has had some upper back and neck pain and body aches throughout.  Started to have some worsening shortness of breath and chest pain the last day.  Denies any alcohol or drug use.  Denies any abdominal pain, pain with urination.  No other significant medical history.  He is not sure what makes his pain better or worse.  The history is provided by the patient.       Home Medications Prior to Admission medications   Medication Sig Start Date End Date Taking? Authorizing Provider  amoxicillin (AMOXIL) 500 MG capsule Take 1 capsule (500 mg total) by mouth 3 (three) times daily. Patient not taking: Reported on 09/17/2021 05/17/21   Evlyn Courier, PA-C  cetirizine (ZYRTEC ALLERGY) 10 MG tablet Take 1 tablet (10 mg total) by mouth daily. 05/29/21 06/28/21  Eustaquio Maize, PA-C  cyclobenzaprine (FLEXERIL) 10 MG tablet Take 1 tablet (10 mg total) by mouth 3 (three) times daily as needed for muscle spasms. Patient not taking: Reported on 09/17/2021 08/29/19   Bjorn Pippin, PA-C  fluticasone [facility]) 50 MCG/ACT nasal spray Place 1 spray into both nostrils daily. Patient not taking: Reported on 09/17/2021 05/29/21   Eustaquio Maize, PA-C  HYDROcodone-acetaminophen (NORCO/VICODIN) 5-325 MG tablet Take 2 tablets by mouth every 4 (four) hours as needed. Patient not taking: Reported on 09/17/2021 04/03/19   Barrie Folk, PA-C  predniSONE (STERAPRED UNI-PAK 21 TAB) 10 MG (21) TBPK tablet Take by mouth daily. Take 6 tabs by mouth daily  for 2 days, then 5 tabs  for 2 days, then 4 tabs for 2 days, then 3 tabs for 2 days, 2 tabs for 2 days, then 1 tab by mouth daily for 2 days Patient not taking: Reported on 09/17/2021 08/29/19   Bjorn Pippin, PA-C      Allergies    Patient has no known allergies.    Review of Systems   Review of Systems  Physical Exam Updated Vital Signs BP (!) 162/120   Pulse 91   Temp 99 F (37.2 C) (Oral)   Resp (!) 22   Ht '5\' 6"'  (1.676 m)   Wt (!) 149.7 kg   SpO2 100%   BMI 53.26 kg/m  Physical Exam Vitals and nursing note reviewed.  Constitutional:      General: He is not in acute distress.    Appearance: He is well-developed. He is not ill-appearing.  HENT:     Head: Normocephalic and atraumatic.  Eyes:     Conjunctiva/sclera: Conjunctivae normal.     Pupils: Pupils are equal, round, and reactive to light.  Cardiovascular:     Rate and Rhythm: Regular rhythm. Tachycardia present.     Pulses: Normal pulses.     Heart sounds: Normal heart sounds. No murmur heard. Pulmonary:     Effort: Pulmonary effort is normal. No respiratory distress.     Breath sounds: Decreased breath sounds present.  Abdominal:     Palpations: Abdomen is soft.  Tenderness: There is no abdominal tenderness.  Musculoskeletal:        General: No swelling. Normal range of motion.     Cervical back: Normal range of motion and neck supple.     Right lower leg: No edema.     Left lower leg: No edema.  Skin:    General: Skin is warm and dry.     Capillary Refill: Capillary refill takes less than 2 seconds.  Neurological:     General: No focal deficit present.     Mental Status: He is alert.  Psychiatric:        Mood and Affect: Mood normal.     ED Results / Procedures / Treatments   Labs (all labs ordered are listed, but only abnormal results are displayed) Labs Reviewed  CBC - Abnormal; Notable for the following components:      Result Value   RBC 6.64 (*)    MCV 67.3 (*)    MCH 21.2 (*)    RDW 18.2 (*)    All  other components within normal limits  TROPONIN I (HIGH SENSITIVITY) - Abnormal; Notable for the following components:   Troponin I (High Sensitivity) 304 (*)    All other components within normal limits  TROPONIN I (HIGH SENSITIVITY) - Abnormal; Notable for the following components:   Troponin I (High Sensitivity) 342 (*)    All other components within normal limits  RESP PANEL BY RT-PCR (FLU A&B, COVID) [id number]  RESPIRATORY PANEL BY PCR  BASIC METABOLIC PANEL  BRAIN NATRIURETIC PEPTIDE  SEDIMENTATION RATE  C-REACTIVE PROTEIN  URINALYSIS, ROUTINE W REFLEX MICROSCOPIC  RAPID URINE DRUG SCREEN, HOSP PERFORMED    EKG EKG Interpretation  Date/Time:  Sunday September 17 2021 19:17:16 EDT Ventricular Rate:  87 PR Interval:  154 QRS Duration: 99 QT Interval:  364 QTC Calculation: 438 R Axis:   262 Text Interpretation: Sinus rhythm Left anterior fascicular block Confirmed by Lennice Sites ([id number]) on 09/17/2021 7:21:13 PM  Radiology CT Angio Chest PE W and/or Wo Contrast  Result Date: 09/17/2021 CLINICAL DATA:  Dyspnea, chest pain EXAM: CT ANGIOGRAPHY CHEST WITH CONTRAST TECHNIQUE: Multidetector CT imaging of the chest was performed using the standard protocol during bolus administration of intravenous contrast. Multiplanar CT image reconstructions and MIPs were obtained to evaluate the vascular anatomy. RADIATION DOSE REDUCTION: This exam was performed according to the departmental dose-optimization program which includes automated exposure control, adjustment of the mA and/or kV according to patient size and/or use of iterative reconstruction technique. CONTRAST:  118m OMNIPAQUE IOHEXOL 350 MG/ML SOLN COMPARISON:  None Available. FINDINGS: Cardiovascular: Adequate opacification of the pulmonary arterial tree. No intraluminal filling defect identified to suggest acute pulmonary embolism. Central pulmonary arteries are of normal caliber. No significant coronary artery calcification. Cardiac size  within normal limits. No pericardial effusion. No significant atherosclerotic calcification within the thoracic aorta. No aortic aneurysm. Mediastinum/Nodes: Visualized thyroid is unremarkable. No pathologic thoracic adenopathy. Esophagus unremarkable. Small hiatal hernia. Lungs/Pleura: Lungs are clear. No pleural effusion or pneumothorax. Upper Abdomen: No acute abnormality. Musculoskeletal: No chest wall abnormality. No acute or significant osseous findings. Review of the MIP images confirms the above findings. IMPRESSION: 1. No pulmonary embolism. No acute intrathoracic findings. 2. Small hiatal hernia. Electronically Signed   By: AFidela SalisburyM.D.   On: 09/17/2021 22:17   DG Chest 2 View  Result Date: 09/17/2021 CLINICAL DATA:  Chest pain, shortness of breath EXAM: CHEST - 2 VIEW COMPARISON:  10/31/2014 FINDINGS: The heart size  and mediastinal contours are within normal limits. Both lungs are clear. The visualized skeletal structures are unremarkable. IMPRESSION: No active cardiopulmonary disease. Electronically Signed   By: Davina Poke D.O.   On: 09/17/2021 17:02    Procedures Procedures    Medications Ordered in ED Medications  sodium chloride (PF) 0.9 % injection (has no administration in time range)  iohexol (OMNIPAQUE) 350 MG/ML injection 100 mL (100 mLs Intravenous Contrast Given 09/17/21 2000)    ED Course/ Medical Decision Making/ A&P                           Medical Decision Making Amount and/or Complexity of Data Reviewed Labs: ordered. Radiology: ordered.  Risk Prescription drug management. Decision regarding hospitalization.   Samuel Greer is here with chest pain and shortness of breath and fever and body aches.  Patient with temperature of 100 upon arrival.  Mildly tachycardic.  Otherwise vital signs are normal.  Patient with viral sounding symptoms last for 5 days.  Chest pain or shortness of breath the last day or 2.  Nothing makes it worse or better.  EKG  shows sinus rhythm.  Some nonspecific ST changes are seen.  No major change from prior EKGs.  Looks like benign repole.  Patient had already CBC and BMP and troponin and chest x-ray done prior to my evaluation.  Troponin is elevated at 304.  I discussed with Dr. Kalman Shan with cardiology who agreed that EKG did not appear consistent with STEMI.  Differential is that this could be a myopericarditis versus may be PE.  Will trend troponin to further work-up ACS.  Chest x-ray did not show pneumonia or pneumothorax.  COVID test is negative.  No significant leukocytosis, anemia, electrolyte abnormality otherwise.  We will add an ESR, CRP, RVP, BNP and a CT scan of his chest.  Per review and interpretation of labs, troponin stable at 342.  Sed rate within normal limits.  CT scan of the chest shows no PE or pneumonia or significant coronary artery calcifications.  No pericardial effusion.  RVP is pending.  Discussed with Dr. Kalman Shan cardiology who does recommend admission and echocardiogram tomorrow.  We will give a dose of Toradol.  My suspicion is for myopericarditis likely secondary to viral process.  This chart was dictated using voice recognition software.  Despite best efforts to proofread,  errors can occur which can change the documentation meaning.         Final Clinical Impression(s) / ED Diagnoses Final diagnoses:  Elevated troponin  Upper respiratory tract infection, unspecified type  Myopericarditis    Rx / DC Orders ED Discharge Orders     None         Lennice Sites, DO 09/17/21 2224

## 2021-09-18 ENCOUNTER — Observation Stay (HOSPITAL_BASED_OUTPATIENT_CLINIC_OR_DEPARTMENT_OTHER): Payer: 59

## 2021-09-18 ENCOUNTER — Encounter (HOSPITAL_COMMUNITY): Payer: Self-pay | Admitting: Internal Medicine

## 2021-09-18 DIAGNOSIS — I10 Essential (primary) hypertension: Secondary | ICD-10-CM

## 2021-09-18 DIAGNOSIS — R079 Chest pain, unspecified: Secondary | ICD-10-CM

## 2021-09-18 DIAGNOSIS — R778 Other specified abnormalities of plasma proteins: Secondary | ICD-10-CM | POA: Diagnosis present

## 2021-09-18 DIAGNOSIS — R072 Precordial pain: Secondary | ICD-10-CM | POA: Diagnosis not present

## 2021-09-18 LAB — COMPREHENSIVE METABOLIC PANEL
ALT: 32 U/L (ref 0–44)
AST: 25 U/L (ref 15–41)
Albumin: 3.8 g/dL (ref 3.5–5.0)
Alkaline Phosphatase: 67 U/L (ref 38–126)
Anion gap: 9 (ref 5–15)
BUN: 13 mg/dL (ref 6–20)
CO2: 24 mmol/L (ref 22–32)
Calcium: 8.9 mg/dL (ref 8.9–10.3)
Chloride: 106 mmol/L (ref 98–111)
Creatinine, Ser: 0.82 mg/dL (ref 0.61–1.24)
GFR, Estimated: >60 mL/min (ref >60)
Glucose, Bld: 87 mg/dL (ref 70–99)
Potassium: 4.1 mmol/L (ref 3.5–5.1)
Sodium: 139 mmol/L (ref 135–145)
Total Bilirubin: 0.4 mg/dL (ref 0.3–1.2)
Total Protein: 7.8 g/dL (ref 6.5–8.1)

## 2021-09-18 LAB — ECHOCARDIOGRAM COMPLETE
AR max vel: 3.53 cm2
AV Peak grad: 6.4 mmHg
Ao pk vel: 1.26 m/s
Area-P 1/2: 4.57 cm2
Calc EF: 56.5 %
Height: 66 in
MV M vel: 4.74 m/s
MV Peak grad: 89.9 mmHg
S' Lateral: 3.7 cm
Single Plane A2C EF: 56.7 %
Single Plane A4C EF: 56.6 %
Weight: 5280 oz

## 2021-09-18 LAB — RESPIRATORY PANEL BY PCR

## 2021-09-18 LAB — SEDIMENTATION RATE: Sed Rate: 23 mm/hr — ABNORMAL HIGH (ref 0–16)

## 2021-09-18 LAB — TSH: TSH: 4.331 u[IU]/mL (ref 0.350–4.500)

## 2021-09-18 LAB — CBC WITH DIFFERENTIAL/PLATELET
Abs Immature Granulocytes: 0.02 10*3/uL (ref 0.00–0.07)
Basophils Absolute: 0.1 10*3/uL (ref 0.0–0.1)
Basophils Relative: 1 %
Eosinophils Absolute: 0.4 10*3/uL (ref 0.0–0.5)
Eosinophils Relative: 4 %
HCT: 42.7 % (ref 39.0–52.0)
Hemoglobin: 13.5 g/dL (ref 13.0–17.0)
Immature Granulocytes: 0 %
Lymphocytes Relative: 27 %
Lymphs Abs: 2.5 10*3/uL (ref 0.7–4.0)
MCH: 21.3 pg — ABNORMAL LOW (ref 26.0–34.0)
MCHC: 31.6 g/dL (ref 30.0–36.0)
MCV: 67.2 fL — ABNORMAL LOW (ref 80.0–100.0)
Monocytes Absolute: 1.1 10*3/uL — ABNORMAL HIGH (ref 0.1–1.0)
Monocytes Relative: 12 %
Neutro Abs: 5.2 10*3/uL (ref 1.7–7.7)
Neutrophils Relative %: 56 %
Platelets: 215 10*3/uL (ref 150–400)
RBC: 6.35 MIL/uL — ABNORMAL HIGH (ref 4.22–5.81)
RDW: 17.3 % — ABNORMAL HIGH (ref 11.5–15.5)
WBC: 9.3 10*3/uL (ref 4.0–10.5)
nRBC: 0 % (ref 0.0–0.2)

## 2021-09-18 LAB — HEMOGLOBIN A1C
Hgb A1c MFr Bld: 6.1 % — ABNORMAL HIGH (ref 4.8–5.6)
Mean Plasma Glucose: 128.37 mg/dL

## 2021-09-18 LAB — LIPID PANEL
Cholesterol: 155 mg/dL (ref 0–200)
HDL: 25 mg/dL — ABNORMAL LOW (ref >40)
LDL Cholesterol: 108 mg/dL — ABNORMAL HIGH (ref 0–99)
Total CHOL/HDL Ratio: 6.2 RATIO
Triglycerides: 112 mg/dL (ref <150)
VLDL: 22 mg/dL (ref 0–40)

## 2021-09-18 LAB — CK: Total CK: 340 U/L (ref 49–397)

## 2021-09-18 LAB — C-REACTIVE PROTEIN
CRP: 7 mg/dL — ABNORMAL HIGH (ref <1.0)
CRP: 11.1 mg/dL — ABNORMAL HIGH (ref <1.0)

## 2021-09-18 LAB — TROPONIN I (HIGH SENSITIVITY)
Troponin I (High Sensitivity): 267 ng/L (ref <18)
Troponin I (High Sensitivity): 225 ng/L (ref <18)

## 2021-09-18 LAB — HIV ANTIBODY (ROUTINE TESTING W REFLEX): HIV Screen 4th Generation wRfx: NONREACTIVE

## 2021-09-18 MED ORDER — ASPIRIN 81 MG PO CHEW
273.0000 mg | CHEWABLE_TABLET | Freq: Once | ORAL | Status: AC
  Administered 2021-09-18: 263.25 mg via ORAL
  Filled 2021-09-18: qty 4

## 2021-09-18 MED ORDER — HYDRALAZINE HCL 20 MG/ML IJ SOLN: 5.0000 mg | INTRAMUSCULAR | Status: DC | PRN

## 2021-09-18 MED ORDER — METOPROLOL TARTRATE 25 MG PO TABS
25.0000 mg | ORAL_TABLET | Freq: Two times a day (BID) | ORAL | Status: DC
  Administered 2021-09-18 – 2021-09-19 (×4): 25 mg via ORAL
  Filled 2021-09-18 (×4): qty 1

## 2021-09-18 MED ORDER — ACETAMINOPHEN 650 MG RE SUPP: 650.0000 mg | Freq: Four times a day (QID) | RECTAL | Status: DC | PRN

## 2021-09-18 MED ORDER — ACETAMINOPHEN 325 MG PO TABS
650.0000 mg | ORAL_TABLET | Freq: Four times a day (QID) | ORAL | Status: DC | PRN
  Administered 2021-09-19: 650 mg via ORAL
  Filled 2021-09-18: qty 2

## 2021-09-18 MED ORDER — ASPIRIN 81 MG PO TBEC
81.0000 mg | DELAYED_RELEASE_TABLET | Freq: Every day | ORAL | Status: DC
Start: 2021-09-18 — End: 2021-09-19
  Administered 2021-09-18 – 2021-09-19 (×3): 81 mg via ORAL
  Filled 2021-09-18 (×3): qty 1

## 2021-09-18 NOTE — H&P (Signed)
History and Physical    STOCKTON NUNLEY GBT:[id number] DOB: [birth year] DOA: 09/17/2021  PCP: Pcp, No  Patient coming from: Home.  Chief Complaint: Chest pain and shortness of breath.  HPI: Samuel Greer is a 31 y.o. male with history of hypertension presents to the ER with complaint of chest pain and shortness of breath for the last 2 days.  Patient states about 3 days ago he developed some fever and chills and had recorded a fever 100.5 at home took some over-the-counter medications.  He started developing some neck pain and eventually started developing chest pain.  Neck pain was present on both side of the neck.  Had no difficulty swallowing.  He did have some chest congestion.  The chest pain was more of pleuritic in nature in the center of the chest radiating to the neck.  Had some shortness of breath associated with it.  ED Course: In the ER patient was afebrile and EKG shows nonspecific findings.  High sensitive troponins were 304 and 342 BNP of 57.  COVID test influenza test were negative.  Respiratory panel was pending.  CT angiogram of the chest was negative for PE.  ER physician discussed with on-call cardiologist advised admission for further observation with possible pericarditis.  Review of Systems: As per HPI, rest all negative.   Past Medical History:  Diagnosis Date   Back pain    Hypertension    Migraine     Past Surgical History:  Procedure Laterality Date   HIP SURGERY     SHOULDER SURGERY     left     reports that he has been smoking cigarettes. He has been smoking an average of .15 packs per day. He has never used smokeless tobacco. He reports current alcohol use. He reports current drug use. Drug: Marijuana.  No Known Allergies  Family History  Problem Relation Age of Onset   Cancer Other    Diabetes Other    Diabetes Mother    Hypertension Mother     Prior to Admission medications   Medication Sig Start Date End Date Taking? Authorizing Provider   amoxicillin (AMOXIL) 500 MG capsule Take 1 capsule (500 mg total) by mouth 3 (three) times daily. Patient not taking: Reported on 09/17/2021 05/17/21   Marita Kansas, PA-C  cetirizine (ZYRTEC ALLERGY) 10 MG tablet Take 1 tablet (10 mg total) by mouth daily. 05/29/21 06/28/21  Tanda Rockers, PA-C  cyclobenzaprine (FLEXERIL) 10 MG tablet Take 1 tablet (10 mg total) by mouth 3 (three) times daily as needed for muscle spasms. Patient not taking: Reported on 09/17/2021 08/29/19   Riki Sheer, PA-C  fluticasone [facility]) 50 MCG/ACT nasal spray Place 1 spray into both nostrils daily. Patient not taking: Reported on 09/17/2021 05/29/21   Tanda Rockers, PA-C  HYDROcodone-acetaminophen (NORCO/VICODIN) 5-325 MG tablet Take 2 tablets by mouth every 4 (four) hours as needed. Patient not taking: Reported on 09/17/2021 04/03/19   Shanon Ace, PA-C  predniSONE (STERAPRED UNI-PAK 21 TAB) 10 MG (21) TBPK tablet Take by mouth daily. Take 6 tabs by mouth daily  for 2 days, then 5 tabs for 2 days, then 4 tabs for 2 days, then 3 tabs for 2 days, 2 tabs for 2 days, then 1 tab by mouth daily for 2 days Patient not taking: Reported on 09/17/2021 08/29/19   Riki Sheer, PA-C    Physical Exam: Constitutional: Moderately built and nourished. Vitals:   09/17/21 1602 09/17/21 1920 09/17/21 1945 09/17/21 2130  BP: (!) 155/119 (!) 139/94 (!) 142/81 (!) 162/120  Pulse: (!) 108 84 86 91  Resp: 18 20 19  (!) 22  Temp: 100 F (37.8 C) 99 F (37.2 C)    TempSrc: Oral Oral    SpO2: 98% 100% 100% 100%  Weight: (!) 149.7 kg     Height: 5\' 6"  (1.676 m)      Eyes: Anicteric no pallor. ENMT: No discharge from the ears eyes nose and mouth. Neck: No mass felt.  No neck rigidity. Respiratory: No rhonchi or crepitations. Cardiovascular: S1-S2 heard. Abdomen: Soft nontender bowel sound present. Musculoskeletal: No edema. Skin: No rash. Neurologic: Alert awake oriented time place and person.  Moves all  extremities. Psychiatric: Appears normal.  Normal affect.   Labs on Admission: I have personally reviewed following labs and imaging studies  CBC: Recent Labs  Lab 09/17/21 1654  WBC 10.1  HGB 14.1  HCT 44.7  MCV 67.3*  PLT 220   Basic Metabolic Panel: Recent Labs  Lab 09/17/21 1654  NA 139  K 4.2  CL 104  CO2 24  GLUCOSE 93  BUN 12  CREATININE 0.95  CALCIUM 9.1   GFR: Estimated Creatinine Clearance: 155.1 mL/min (by C-G formula based on SCr of 0.95 mg/dL). Liver Function Tests: No results for input(s): "AST", "ALT", "ALKPHOS", "BILITOT", "PROT", "ALBUMIN" in the last 168 hours. No results for input(s): "LIPASE", "AMYLASE" in the last 168 hours. No results for input(s): "AMMONIA" in the last 168 hours. Coagulation Profile: No results for input(s): "INR", "PROTIME" in the last 168 hours. Cardiac Enzymes: No results for input(s): "CKTOTAL", "CKMB", "CKMBINDEX", "TROPONINI" in the last 168 hours. BNP (last 3 results) No results for input(s): "PROBNP" in the last 8760 hours. HbA1C: No results for input(s): "HGBA1C" in the last 72 hours. CBG: No results for input(s): "GLUCAP" in the last 168 hours. Lipid Profile: No results for input(s): "CHOL", "HDL", "LDLCALC", "TRIG", "CHOLHDL", "LDLDIRECT" in the last 72 hours. Thyroid Function Tests: No results for input(s): "TSH", "T4TOTAL", "FREET4", "T3FREE", "THYROIDAB" in the last 72 hours. Anemia Panel: No results for input(s): "VITAMINB12", "FOLATE", "FERRITIN", "TIBC", "IRON", "RETICCTPCT" in the last 72 hours. Urine analysis:    Component Value Date/Time   COLORURINE YELLOW 09/17/2021 2246   APPEARANCEUR CLEAR 09/17/2021 2246   LABSPEC 1.038 (H) 09/17/2021 2246   PHURINE 5.0 09/17/2021 2246   GLUCOSEU NEGATIVE 09/17/2021 2246   HGBUR NEGATIVE 09/17/2021 2246   BILIRUBINUR NEGATIVE 09/17/2021 2246   KETONESUR NEGATIVE 09/17/2021 2246   PROTEINUR NEGATIVE 09/17/2021 2246   UROBILINOGEN 1.0 01/03/2012 0905    NITRITE NEGATIVE 09/17/2021 2246   LEUKOCYTESUR NEGATIVE 09/17/2021 2246   Sepsis Labs: @LABRCNTIP (procalcitonin:4,lacticidven:4) ) Recent Results (from the past 240 hour(s))  Resp Panel by RT-PCR (Flu A&B, Covid) Anterior Nasal Swab     Status: None   Collection Time: 09/17/21  4:54 PM   Specimen: Anterior Nasal Swab  Result Value Ref Range Status   SARS Coronavirus 2 by RT PCR NEGATIVE NEGATIVE Final    Comment: (NOTE) SARS-CoV-2 target nucleic acids are NOT DETECTED.  The SARS-CoV-2 RNA is generally detectable in upper respiratory specimens during the acute phase of infection. The lowest concentration of SARS-CoV-2 viral copies this assay can detect is 138 copies/mL. A negative result does not preclude SARS-Cov-2 infection and should not be used as the sole basis for treatment or other patient management decisions. A negative result may occur with  improper specimen collection/handling, submission of specimen other than nasopharyngeal swab, presence of viral  mutation(s) within the areas targeted by this assay, and inadequate number of viral copies(<138 copies/mL). A negative result must be combined with clinical observations, patient history, and epidemiological information. The expected result is Negative.  Fact Sheet for Patients:  BloggerCourse.com  Fact Sheet for Healthcare Providers:  SeriousBroker.it  This test is no t yet approved or cleared by the [place] FDA and  has been authorized for detection and/or diagnosis of SARS-CoV-2 by FDA under an Emergency Use Authorization (EUA). This EUA will remain  in effect (meaning this test can be used) for the duration of the COVID-19 declaration under Section 564(b)(1) of the Act, 21 U.S.C.section 360bbb-3(b)(1), unless the authorization is terminated  or revoked sooner.       Influenza A by PCR NEGATIVE NEGATIVE Final   Influenza B by PCR NEGATIVE NEGATIVE Final     Comment: (NOTE) The Xpert Xpress SARS-CoV-2/FLU/RSV plus assay is intended as an aid in the diagnosis of influenza from Nasopharyngeal swab specimens and should not be used as a sole basis for treatment. Nasal washings and aspirates are unacceptable for Xpert Xpress SARS-CoV-2/FLU/RSV testing.  Fact Sheet for Patients: BloggerCourse.com  Fact Sheet for Healthcare Providers: SeriousBroker.it  This test is not yet approved or cleared by the [place] FDA and has been authorized for detection and/or diagnosis of SARS-CoV-2 by FDA under an Emergency Use Authorization (EUA). This EUA will remain in effect (meaning this test can be used) for the duration of the COVID-19 declaration under Section 564(b)(1) of the Act, 21 U.S.C. section 360bbb-3(b)(1), unless the authorization is terminated or revoked.  Performed at [facility], [address], [place], [place] [address]      Radiological Exams on Admission: CT Angio Chest PE W and/or Wo Contrast  Result Date: 09/17/2021 CLINICAL DATA:  Dyspnea, chest pain EXAM: CT ANGIOGRAPHY CHEST WITH CONTRAST TECHNIQUE: Multidetector CT imaging of the chest was performed using the standard protocol during bolus administration of intravenous contrast. Multiplanar CT image reconstructions and MIPs were obtained to evaluate the vascular anatomy. RADIATION DOSE REDUCTION: This exam was performed according to the departmental dose-optimization program which includes automated exposure control, adjustment of the mA and/or kV according to patient size and/or use of iterative reconstruction technique. CONTRAST:  OMNIPAQUE IOHEXOL 350 MG/ML SOLN COMPARISON:  None Available. FINDINGS: Cardiovascular: Adequate opacification of the pulmonary arterial tree. No intraluminal filling defect identified to suggest acute pulmonary embolism. Central pulmonary arteries are of normal caliber. No  significant coronary artery calcification. Cardiac size within normal limits. No pericardial effusion. No significant atherosclerotic calcification within the thoracic aorta. No aortic aneurysm. Mediastinum/Nodes: Visualized thyroid is unremarkable. No pathologic thoracic adenopathy. Esophagus unremarkable. Small hiatal hernia. Lungs/Pleura: Lungs are clear. No pleural effusion or pneumothorax. Upper Abdomen: No acute abnormality. Musculoskeletal: No chest wall abnormality. No acute or significant osseous findings. Review of the MIP images confirms the above findings. IMPRESSION: 1. No pulmonary embolism. No acute intrathoracic findings. 2. Small hiatal hernia. Electronically Signed   By: Helyn Numbers M.D.   On: 09/17/2021 22:17   DG Chest 2 View  Result Date: 09/17/2021 CLINICAL DATA:  Chest pain, shortness of breath EXAM: CHEST - 2 VIEW COMPARISON:  10/31/2014 FINDINGS: The heart size and mediastinal contours are within normal limits. Both lungs are clear. The visualized skeletal structures are unremarkable. IMPRESSION: No active cardiopulmonary disease. Electronically Signed   By: Duanne Guess D.O.   On: 09/17/2021 17:02    EKG: Independently reviewed.  Normal sinus rhythm.  Nonspecific changes.  Assessment/Plan Principal Problem:   Chest pain Active Problems:   Elevated troponin   Essential hypertension    Chest pain with elevated troponin -cause not clear.  Along the differentials are pericarditis given the recent fever and respiratory tract symptoms.  We will trend cardiac markers check 2D echo consult cardiology.  Keep patient on aspirin for now. Hypertension -patient states he was diagnosed with hypertension about 4 years ago and has not taken any different therapy.  Patient blood pressure remains elevated at this time.  We will start with metoprolol 25 p.o. twice daily and keep patient on as needed IV hydralazine.  Follow blood pressure trends. Recent respiratory tract symptoms with  fever for which blood cultures have been ordered.  Neck pain has largely resolved.  If neck pain persists will consider further imaging for the CT scan. Marijuana use will need counseling.   DVT prophylaxis: SCDs for now avoiding anticoagulation since patient has possible pericarditis. Code Status: Full code. Family Communication: Discussed with patient. Disposition Plan: Home. Consults called: ER physician discussed with cardiologist. Admission status: Observation.   Eduard Clos MD [facility] Pager [phone].  If 7PM-7AM, please contact night-coverage [email or web address] Password [facility]  09/18/2021, 12:31 AM

## 2021-09-18 NOTE — ED Notes (Signed)
Pt 02 drop down to 88% room air while sleeping. RN aware

## 2021-09-18 NOTE — Progress Notes (Addendum)
Interim progress note - not for billing  Patient admitted earlier this morning. 61m hx morbid obesity, htn, presenting with several days DOE and substernal chest pain radiating to neck. No precipitating illness or med changes. Currently asymptomatic. Vitals wnl. CXR neg. Bnp normal. Troponin elevated 304>342>225. CT negative for  infection, PE. Uds pos for thc, patient denies other drugs including cocaine. Respiratory panel neg. Per admit note case discussed w/ on call cardiology Dr. Okey Dupre who advised admission for observation, possible pericarditis. Appears cardiology did not advise anticoagulation. I have messaged cardiology who will see patient today. I have added on risk stratification labs. Received 81 mg today, will order 273 so that gets full dose.

## 2021-09-18 NOTE — Consult Note (Addendum)
Cardiology Consultation:   Patient ID: Samuel Greer MRN: [id number]; DOB: 08-28-1990  Admit date: 09/17/2021 Date of Consult: 09/18/2021  PCP:  Oneita Hurt, No   [facility] Providers Cardiologist:  New  Patient Profile:   Samuel Greer is a 31 y.o. male with a hx of tobacco use, marijuana use and hypertension who is being seen 09/18/2021 for the evaluation of chest pain and shortness of breath at the request of Dr. Ashok Pall.  History of Present Illness:   Mr. Schetter has no significant cardiac history. Multiple ER visits over the years. He reports marijuana use every other day. Also reports occasional tobacco use. Says his mother has diabetes and possible heart failure.   The patient presented to [facility] ER 7/16 for chest pain and SOB. He reported a fever and chills about 3 days ago. He had a cough and sore throat as well. He also started feeling very short of breath. He noted chest pain described as a pressure that was worse with inspiration. He was taking OTC medications for this. When symptoms didn't improve he came to the ER.   In the ER BP 162/120, pulse 91, Temp 99 F. RR 22, 100% O2. Labs showed WBC 10.1, Hgb 14.1, normal BMET. HS trop 304>325>267>225. BNP 57. COVID negative. Respiratory panel pending. Cxr negative. CTA chest was negative for PE. EKG showed ST with nonspecific ST changes. Cardiology was contacted, who did not recommend anticoagulation. He was admitted for observation for possible pericarditis.    Past Medical History:  Diagnosis Date   Back pain    Hypertension    Migraine     Past Surgical History:  Procedure Laterality Date   HIP SURGERY     SHOULDER SURGERY     left     Home Medications:  Prior to Admission medications   Medication Sig Start Date End Date Taking? Authorizing Provider  amoxicillin (AMOXIL) 500 MG capsule Take 1 capsule (500 mg total) by mouth 3 (three) times daily. Patient not taking: Reported on 09/17/2021 05/17/21   Marita Kansas, PA-C   cetirizine (ZYRTEC ALLERGY) 10 MG tablet Take 1 tablet (10 mg total) by mouth daily. 05/29/21 06/28/21  Tanda Rockers, PA-C  cyclobenzaprine (FLEXERIL) 10 MG tablet Take 1 tablet (10 mg total) by mouth 3 (three) times daily as needed for muscle spasms. Patient not taking: Reported on 09/17/2021 08/29/19   Riki Sheer, PA-C  fluticasone [facility]) 50 MCG/ACT nasal spray Place 1 spray into both nostrils daily. Patient not taking: Reported on 09/17/2021 05/29/21   Tanda Rockers, PA-C  HYDROcodone-acetaminophen (NORCO/VICODIN) 5-325 MG tablet Take 2 tablets by mouth every 4 (four) hours as needed. Patient not taking: Reported on 09/17/2021 04/03/19   Shanon Ace, PA-C  predniSONE (STERAPRED UNI-PAK 21 TAB) 10 MG (21) TBPK tablet Take by mouth daily. Take 6 tabs by mouth daily  for 2 days, then 5 tabs for 2 days, then 4 tabs for 2 days, then 3 tabs for 2 days, 2 tabs for 2 days, then 1 tab by mouth daily for 2 days Patient not taking: Reported on 09/17/2021 08/29/19   Riki Sheer, PA-C    Inpatient Medications: Scheduled Meds:  aspirin EC  81 mg Oral Daily   metoprolol tartrate  25 mg Oral BID   Continuous Infusions:  PRN Meds: acetaminophen **OR** acetaminophen, hydrALAZINE  Allergies:   No Known Allergies  Social History:   Social History   Socioeconomic History   Marital status: Single    Spouse  name: Not on file   Number of children: Not on file   Years of education: Not on file   Highest education level: Not on file  Occupational History   Not on file  Tobacco Use   Smoking status: Every Day    Packs/day: 0.15    Types: Cigarettes   Smokeless tobacco: Never  Vaping Use   Vaping Use: Never used  Substance and Sexual Activity   Alcohol use: Yes    Comment: social   Drug use: Yes    Types: Marijuana   Sexual activity: Not Currently  Other Topics Concern   Not on file  Social History Narrative   Not on file   Social Determinants of Health    Financial Resource Strain: Not on file  Food Insecurity: Not on file  Transportation Needs: Not on file  Physical Activity: Not on file  Stress: Not on file  Social Connections: Not on file  Intimate Partner Violence: Not on file    Family History:    Family History  Problem Relation Age of Onset   Cancer Other    Diabetes Other    Diabetes Mother    Hypertension Mother      ROS:  Please see the history of present illness.   All other ROS reviewed and negative.     Physical Exam/Data:   Vitals:   09/17/21 2130 09/18/21 0330 09/18/21 0630 09/18/21 0800  BP: (!) 162/120 (!) 114/53 117/70 128/82  Pulse: 91 70 74 78  Resp: (!) 22 16 (!) 24 10  Temp:   98.5 F (36.9 C)   TempSrc:   Oral   SpO2: 100% 98% 98% 95%  Weight:      Height:       No intake or output data in the 24 hours ending 09/18/21 1042    09/17/2021    4:02 PM 05/29/2021   12:54 PM 05/17/2021    2:43 PM  Last 3 Weights  Weight (lbs) 330 lb 326 lb 327 lb  Weight (kg) 149.687 kg 147.873 kg 148.326 kg     Body mass index is 53.26 kg/m.  General:  Well nourished, well developed, in no acute distress HEENT: normal Neck: no JVD Vascular: No carotid bruits; Distal pulses 2+ bilaterally Cardiac:  normal S1, S2; RRR; no murmur  Lungs:  clear to auscultation bilaterally, no wheezing, rhonchi or rales  Abd: soft, nontender, no hepatomegaly  Ext: no edema Musculoskeletal:  No deformities, BUE and BLE strength normal and equal Skin: warm and dry  Neuro:  CNs 2-12 intact, no focal abnormalities noted Psych:  Normal affect   EKG:  The EKG was personally reviewed and demonstrates:  ST 101bpm, nonspecific ST/T wave changes Telemetry:  Telemetry was personally reviewed and demonstrates:  NSR HR 70s  Relevant CV Studies:  Echo ordered   Laboratory Data:  High Sensitivity Troponin:   Recent Labs  Lab 09/17/21 1654 09/17/21 1935 09/18/21 0127 09/18/21 0506  TROPONINIHS 304* 342* 267* 225*      Chemistry Recent Labs  Lab 09/17/21 1654 09/18/21 0506  NA 139 139  K 4.2 4.1  CL 104 106  CO2 24 24  GLUCOSE 93 87  BUN 12 13  CREATININE 0.95 0.82  CALCIUM 9.1 8.9  GFRNONAA >60 >60  ANIONGAP 11 9    Recent Labs  Lab 09/18/21 0506  PROT 7.8  ALBUMIN 3.8  AST 25  ALT 32  ALKPHOS 67  BILITOT 0.4   Lipids No results  for input(s): "CHOL", "TRIG", "HDL", "LABVLDL", "LDLCALC", "CHOLHDL" in the last 168 hours.  Hematology Recent Labs  Lab 09/17/21 1654 09/18/21 0506  WBC 10.1 9.3  RBC 6.64* 6.35*  HGB 14.1 13.5  HCT 44.7 42.7  MCV 67.3* 67.2*  MCH 21.2* 21.3*  MCHC 31.5 31.6  RDW 18.2* 17.3*  PLT 220 215   Thyroid No results for input(s): "TSH", "FREET4" in the last 168 hours.  BNP Recent Labs  Lab 09/17/21 1935  BNP 57.0    DDimer No results for input(s): "DDIMER" in the last 168 hours.   Radiology/Studies:  CT Angio Chest PE W and/or Wo Contrast  Result Date: 09/17/2021 CLINICAL DATA:  Dyspnea, chest pain EXAM: CT ANGIOGRAPHY CHEST WITH CONTRAST TECHNIQUE: Multidetector CT imaging of the chest was performed using the standard protocol during bolus administration of intravenous contrast. Multiplanar CT image reconstructions and MIPs were obtained to evaluate the vascular anatomy. RADIATION DOSE REDUCTION: This exam was performed according to the departmental dose-optimization program which includes automated exposure control, adjustment of the mA and/or kV according to patient size and/or use of iterative reconstruction technique. CONTRAST:  OMNIPAQUE IOHEXOL 350 MG/ML SOLN COMPARISON:  None Available. FINDINGS: Cardiovascular: Adequate opacification of the pulmonary arterial tree. No intraluminal filling defect identified to suggest acute pulmonary embolism. Central pulmonary arteries are of normal caliber. No significant coronary artery calcification. Cardiac size within normal limits. No pericardial effusion. No significant atherosclerotic calcification  within the thoracic aorta. No aortic aneurysm. Mediastinum/Nodes: Visualized thyroid is unremarkable. No pathologic thoracic adenopathy. Esophagus unremarkable. Small hiatal hernia. Lungs/Pleura: Lungs are clear. No pleural effusion or pneumothorax. Upper Abdomen: No acute abnormality. Musculoskeletal: No chest wall abnormality. No acute or significant osseous findings. Review of the MIP images confirms the above findings. IMPRESSION: 1. No pulmonary embolism. No acute intrathoracic findings. 2. Small hiatal hernia. Electronically Signed   By: Helyn Numbers M.D.   On: 09/17/2021 22:17   DG Chest 2 View  Result Date: 09/17/2021 CLINICAL DATA:  Chest pain, shortness of breath EXAM: CHEST - 2 VIEW COMPARISON:  10/31/2014 FINDINGS: The heart size and mediastinal contours are within normal limits. Both lungs are clear. The visualized skeletal structures are unremarkable. IMPRESSION: No active cardiopulmonary disease. Electronically Signed   By: Duanne Guess D.O.   On: 09/17/2021 17:02     Assessment and Plan:   Chest pain/SOB Possible Pericarditis - presented with fever, chills, cough, sore throat, followed by chest pain and SOB for the last 3 days. Chest pain sounds pleuritic in nature. He denies active chest pain - HS trop elevated peak to 342 and now down trending - EKG showed ST 101bpm with nonspecific ST changes - Wbc wnl - CXR unremarkable and CTA chest negative for PE. - check sed rate and CRP - he was given ASA 324mg  - continue ASA 81mg  daily - started on Lopressor 25mg  BID - lipid panel and A1C ordered - Chest CT showed no significant coronary artery calcification and effusion - Echo ordered - suspicion more so for Pericarditis. Lower suspicion for ACS given age and associated symptoms. Can consider Cardiac CTA in the future for ischemic eval.  HTN - started on metoprolol 25mg BID - BP reasonable  Marijuana use - complete cessation advised  For questions or updates, please  contact [facility] Please consult [email or web address] for contact info under  Signed, [place] , PA-C  09/18/2021 10:42 AM  As above, patient seen and examined.  Briefly he is a 31 year old male with past medical  history of hypertension and nephrolithiasis for evaluation of chest pain.  Patient states that he began noticing some dyspnea 5 days prior to admission.  3 days prior to admission he noted chest and neck pain.  It was substernal without radiation.  He had intermittent nausea but no vomiting.  He did note some dyspnea.  The pain was described as a sharp pain.  Some increase with deep inspiration but no change with position.  His pain was continuous without completely resolving for 3 days.  It has resolved this morning.  Also note he had fever and chills 3 days prior to admission with general malaise.  He denies productive cough, diarrhea or dysuria.  He was admitted and troponin minimally elevated.  Cardiology now asked to evaluate. Chest CT shows no pulmonary embolus.  Troponins are 304, 342, 267 and 225.  BNP 57.  Hemoglobin 13.5 with MCV 67.2.  Sed rate 11.  Drug screen positive for marijuana.  Electrocardiogram shows normal sinus rhythm with left axis deviation.  1 chest pain-etiology of symptoms unclear.  However it occurred in the setting of fever, chills and general malaise.  Electrocardiogram without acute ST changes.  Troponin minimally elevated.  Question low-grade myocarditis.  We will await results of echocardiogram.  If LV function normal with no wall motion abnormalities would arrange cardiac CTA as an outpatient to rule out obstructive coronary disease.  2 hypertension-blood pressure appears to be controlled.  Continue present medications and follow-up.  3 tobacco abuse/marijuana abuse-patient counseled on discontinuing.  Olga Millers

## 2021-09-18 NOTE — ED Notes (Signed)
Apple juice provided

## 2021-09-19 DIAGNOSIS — R778 Other specified abnormalities of plasma proteins: Secondary | ICD-10-CM | POA: Diagnosis not present

## 2021-09-19 DIAGNOSIS — R072 Precordial pain: Secondary | ICD-10-CM

## 2021-09-19 DIAGNOSIS — R7303 Prediabetes: Secondary | ICD-10-CM

## 2021-09-19 NOTE — TOC Progression Note (Signed)
Transition of Care [facility]) - Progression Note    Patient Details  Name: Samuel Greer MRN: [id number] Date of Birth: Jan 23, 1991  Transition of Care [facility]) CM/SW Contact  Armanda Heritage, RN Phone Number: 09/19/2021, 9:20 AM  Clinical Narrative:        Transition of Care Department [facility]) has reviewed patient and no TOC needs have been identified at this time. We will continue to monitor patient advancement through interdisciplinary progression rounds. If new patient transition needs arise, please place a TOC consult.

## 2021-09-19 NOTE — Progress Notes (Addendum)
  [facility] LONG [facility] AND UROLOGY [address] [place], [place]  [address] Phone:  [phone]  September 19, 2021   To Whom It May Concern:  Samuel Greer ([birth year]) was admitted to [facility] from 09/17/2021 - 09/19/2021.   Phillis Knack, RN

## 2021-09-19 NOTE — Discharge Summary (Signed)
Physician Discharge Summary  Samuel Greer WCB:[id number] DOB: 11/10/1990 DOA: 09/17/2021  PCP: Pcp, No  Admit date: 09/17/2021 Discharge date: 09/19/2021  Time spent: 35 minutes  Recommendations for Outpatient Follow-up:  [facility] in 1 month PCP in 1 week   Discharge Diagnoses:  Principal Problem:   Chest pain Active Problems:   Elevated troponin   Essential hypertension   Borderline diabetes   Discharge Condition: Stable  Diet recommendation: Diabetic, low-sodium heart healthy  Filed Weights   09/17/21 1602  Weight: (!) 149.7 kg    History of present illness:  31 year old male with history of tobacco cannabis use, hypertension, morbid obesity presented to the ED with chest discomfort and dyspnea  Hospital Course:   Chest pain/Dyspnea -resolved -had runny nose and low grade fever at the time of admission, since resolved -Troponin was mildly elevated in the 200-300 range, UDS positive for THC only, seen by cardiology in consultation, CTA chest was negative for PE or acute infectious process, initially there was a concern for pericarditis, however 2D echo was negative for pericardial fluid, CTA chest was negative for effusion as well, seen by cardiology in consultation, they felt that if his echo was normal with normal wall motion, he could be discharged home with cardiology follow-up, they will likely obtain a stress test as outpatient  Borderline diabetes -HbA1c was 6.1 consistent with borderline diabetes, discussed with patient at length regarding lifestyle modification and importance of weight loss, follow-up with PCP  Morbid obesity -BMI 53.2, encourage weight loss and lifestyle modification  Discharge Exam: Vitals:   09/19/21 0214 09/19/21 0631  BP: (!) 137/98 (!) 139/94  Pulse: 78 82  Resp: 17 18  Temp: 98.3 F (36.8 C) 99 F (37.2 C)  SpO2: 99% 99%   Gen: Awake, Alert, Oriented X 3,  HEENT: no JVD Lungs: Good air movement bilaterally,  CTAB CVS: S1S2/RRR Abd: soft, Non tender, non distended, BS present Extremities: No edema Skin: no new rashes on exposed skin    Discharge Instructions   Discharge Instructions     Ambulatory referral to Cardiology   Complete by: As directed    Diet - low sodium heart healthy   Complete by: As directed    Diet Carb Modified   Complete by: As directed    Increase activity slowly   Complete by: As directed       Allergies as of 09/19/2021   No Known Allergies      Medication List     STOP taking these medications    amoxicillin 500 MG capsule Commonly known as: AMOXIL   cetirizine 10 MG tablet Commonly known as: ZyrTEC Allergy   cyclobenzaprine 10 MG tablet Commonly known as: FLEXERIL   fluticasone 50 MCG/ACT nasal spray Commonly known as: FLONASE   HYDROcodone-acetaminophen 5-325 MG tablet Commonly known as: NORCO/VICODIN   predniSONE 10 MG (21) Tbpk tablet Commonly known as: STERAPRED UNI-PAK 21 TAB       No Known Allergies    The results of significant diagnostics from this hospitalization (including imaging, microbiology, ancillary and laboratory) are listed below for reference.    Significant Diagnostic Studies: ECHOCARDIOGRAM COMPLETE  Result Date: 09/18/2021    ECHOCARDIOGRAM REPORT   Patient Name:   Samuel Greer Date of Exam: 09/18/2021 Medical Rec #:  [id number]       Height:       66.0 in Accession #:    [id number]      Weight:  330.0 lb Date of Birth:  20-Dec-1990       BSA:          2.474 m Patient Age:    31 years        BP:           117/70 mmHg Patient Gender: M               HR:           70 bpm. Exam Location:  Inpatient Procedure: 2D Echo, Cardiac Doppler and Color Doppler Indications:    Chest pain  History:        Patient has no prior history of Echocardiogram examinations.                 Risk Factors:Hypertension.  Sonographer:    Cleatis Polka Referring Phys: [phone] ARSHAD N KAKRAKANDY IMPRESSIONS  1. Left ventricular ejection  fraction, by estimation, is 55 to 60%. The left ventricle has normal function. The left ventricle has no regional wall motion abnormalities. Left ventricular diastolic parameters were normal.  2. Right ventricular systolic function is normal. The right ventricular size is normal.  3. Left atrial size was mildly dilated.  4. The mitral valve is normal in structure. Trivial mitral valve regurgitation. No evidence of mitral stenosis.  5. The aortic valve is tricuspid. Aortic valve regurgitation is not visualized. No aortic stenosis is present.  6. The inferior vena cava is normal in size with greater than 50% respiratory variability, suggesting right atrial pressure of 3 mmHg. Comparison(s): No prior Echocardiogram. Conclusion(s)/Recommendation(s): Normal biventricular function without evidence of hemodynamically significant valvular heart disease. FINDINGS  Left Ventricle: Left ventricular ejection fraction, by estimation, is 55 to 60%. The left ventricle has normal function. The left ventricle has no regional wall motion abnormalities. The left ventricular internal cavity size was normal in size. There is  borderline left ventricular hypertrophy. Left ventricular diastolic parameters were normal. Right Ventricle: The right ventricular size is normal. No increase in right ventricular wall thickness. Right ventricular systolic function is normal. Left Atrium: Left atrial size was mildly dilated. Right Atrium: Right atrial size was normal in size. Pericardium: There is no evidence of pericardial effusion. Mitral Valve: The mitral valve is normal in structure. Trivial mitral valve regurgitation. No evidence of mitral valve stenosis. Tricuspid Valve: The tricuspid valve is normal in structure. Tricuspid valve regurgitation is not demonstrated. No evidence of tricuspid stenosis. Aortic Valve: The aortic valve is tricuspid. Aortic valve regurgitation is not visualized. No aortic stenosis is present. Aortic valve peak  gradient measures 6.4 mmHg. Pulmonic Valve: The pulmonic valve was not well visualized. Pulmonic valve regurgitation is not visualized. No evidence of pulmonic stenosis. Aorta: The aortic root, ascending aorta and aortic arch are all structurally normal, with no evidence of dilitation or obstruction. Venous: The inferior vena cava is normal in size with greater than 50% respiratory variability, suggesting right atrial pressure of 3 mmHg. IAS/Shunts: The atrial septum is grossly normal.  LEFT VENTRICLE PLAX 2D LVIDd:         5.20 cm      Diastology LVIDs:         3.70 cm      LV e' medial:    7.51 cm/s LV PW:         1.40 cm      LV E/e' medial:  8.4 LV IVS:        1.20 cm      LV e' lateral:  6.31 cm/s LVOT diam:     2.20 cm      LV E/e' lateral: 10.0 LV SV:         75 LV SV Index:   30 LVOT Area:     3.80 cm  LV Volumes (MOD) LV vol d, MOD A2C: 161.0 ml LV vol d, MOD A4C: 173.0 ml LV vol s, MOD A2C: 69.7 ml LV vol s, MOD A4C: 75.0 ml LV SV MOD A2C:     91.3 ml LV SV MOD A4C:     173.0 ml LV SV MOD BP:      95.6 ml RIGHT VENTRICLE             IVC RV Basal diam:  3.50 cm     IVC diam: 1.80 cm RV Mid diam:    3.50 cm RV S prime:     13.80 cm/s TAPSE (M-mode): 2.2 cm LEFT ATRIUM             Index        RIGHT ATRIUM           Index LA diam:        4.30 cm 1.74 cm/m   RA Area:     18.30 cm LA Vol (A2C):   65.4 ml 26.43 ml/m  RA Volume:   53.10 ml  21.46 ml/m LA Vol (A4C):   74.4 ml 30.07 ml/m LA Biplane Vol: 72.9 ml 29.47 ml/m  AORTIC VALVE AV Area (Vmax): 3.53 cm AV Vmax:        126.00 cm/s AV Peak Grad:   6.4 mmHg LVOT Vmax:      117.00 cm/s LVOT Vmean:     87.800 cm/s LVOT VTI:       0.198 m  AORTA Ao Root diam: 3.50 cm Ao Asc diam:  3.10 cm MITRAL VALVE MV Area (PHT): 4.57 cm    SHUNTS MV Decel Time: 166 msec    Systemic VTI:  0.20 m MR Peak grad: 89.9 mmHg    Systemic Diam: 2.20 cm MR Vmax:      474.00 cm/s MV E velocity: 63.40 cm/s MV A velocity: 45.80 cm/s MV E/A ratio:  1.38 Jodelle Red MD  Electronically signed by Jodelle Red MD Signature Date/Time: 09/18/2021/4:03:46 PM    Final    CT Angio Chest PE W and/or Wo Contrast  Result Date: 09/17/2021 CLINICAL DATA:  Dyspnea, chest pain EXAM: CT ANGIOGRAPHY CHEST WITH CONTRAST TECHNIQUE: Multidetector CT imaging of the chest was performed using the standard protocol during bolus administration of intravenous contrast. Multiplanar CT image reconstructions and MIPs were obtained to evaluate the vascular anatomy. RADIATION DOSE REDUCTION: This exam was performed according to the departmental dose-optimization program which includes automated exposure control, adjustment of the mA and/or kV according to patient size and/or use of iterative reconstruction technique. CONTRAST:  OMNIPAQUE IOHEXOL 350 MG/ML SOLN COMPARISON:  None Available. FINDINGS: Cardiovascular: Adequate opacification of the pulmonary arterial tree. No intraluminal filling defect identified to suggest acute pulmonary embolism. Central pulmonary arteries are of normal caliber. No significant coronary artery calcification. Cardiac size within normal limits. No pericardial effusion. No significant atherosclerotic calcification within the thoracic aorta. No aortic aneurysm. Mediastinum/Nodes: Visualized thyroid is unremarkable. No pathologic thoracic adenopathy. Esophagus unremarkable. Small hiatal hernia. Lungs/Pleura: Lungs are clear. No pleural effusion or pneumothorax. Upper Abdomen: No acute abnormality. Musculoskeletal: No chest wall abnormality. No acute or significant osseous findings. Review of the MIP images confirms the above findings. IMPRESSION: 1. No  pulmonary embolism. No acute intrathoracic findings. 2. Small hiatal hernia. Electronically Signed   By: Helyn NumbersAshesh  Parikh M.D.   On: 09/17/2021 22:17   DG Chest 2 View  Result Date: 09/17/2021 CLINICAL DATA:  Chest pain, shortness of breath EXAM: CHEST - 2 VIEW COMPARISON:  10/31/2014 FINDINGS: The heart size and  mediastinal contours are within normal limits. Both lungs are clear. The visualized skeletal structures are unremarkable. IMPRESSION: No active cardiopulmonary disease. Electronically Signed   By: Duanne GuessNicholas  Plundo D.O.   On: 09/17/2021 17:02    Microbiology: Recent Results (from the past 240 hour(s))  Respiratory (~20 pathogens) panel by PCR     Status: None   Collection Time: 09/17/21  4:53 PM  Result Value Ref Range Status   Adenovirus NOT DETECTED NOT DETECTED Final   Coronavirus 229E NOT DETECTED NOT DETECTED Final    Comment: (NOTE) The Coronavirus on the Respiratory Panel, DOES NOT test for the novel  Coronavirus (2019 nCoV)    Coronavirus HKU1 NOT DETECTED NOT DETECTED Final   Coronavirus NL63 NOT DETECTED NOT DETECTED Final   Coronavirus OC43 NOT DETECTED NOT DETECTED Final   Metapneumovirus NOT DETECTED NOT DETECTED Final   Rhinovirus / Enterovirus NOT DETECTED NOT DETECTED Final   Influenza A NOT DETECTED NOT DETECTED Final   Influenza B NOT DETECTED NOT DETECTED Final   Parainfluenza Virus 1 NOT DETECTED NOT DETECTED Final   Parainfluenza Virus 2 NOT DETECTED NOT DETECTED Final   Parainfluenza Virus 3 NOT DETECTED NOT DETECTED Final   Parainfluenza Virus 4 NOT DETECTED NOT DETECTED Final   Respiratory Syncytial Virus NOT DETECTED NOT DETECTED Final   Bordetella pertussis NOT DETECTED NOT DETECTED Final   Bordetella Parapertussis NOT DETECTED NOT DETECTED Final   Chlamydophila pneumoniae NOT DETECTED NOT DETECTED Final   Mycoplasma pneumoniae NOT DETECTED NOT DETECTED Final    Comment: Performed at [facility], [address] [place], [place] [address]  Resp Panel by RT-PCR (Flu A&B, Covid) Anterior Nasal Swab     Status: None   Collection Time: 09/17/21  4:54 PM   Specimen: Anterior Nasal Swab  Result Value Ref Range Status   SARS Coronavirus 2 by RT PCR NEGATIVE NEGATIVE Final    Comment: (NOTE) SARS-CoV-2 target nucleic acids are NOT DETECTED.  The  SARS-CoV-2 RNA is generally detectable in upper respiratory specimens during the acute phase of infection. The lowest concentration of SARS-CoV-2 viral copies this assay can detect is 138 copies/mL. A negative result does not preclude SARS-Cov-2 infection and should not be used as the sole basis for treatment or other patient management decisions. A negative result may occur with  improper specimen collection/handling, submission of specimen other than nasopharyngeal swab, presence of viral mutation(s) within the areas targeted by this assay, and inadequate number of viral copies(<138 copies/mL). A negative result must be combined with clinical observations, patient history, and epidemiological information. The expected result is Negative.  Fact Sheet for Patients:  BloggerCourse.comhttps://www.fda.gov/media/152166/download  Fact Sheet for Healthcare Providers:  SeriousBroker.ithttps://www.fda.gov/media/152162/download  This test is no t yet approved or cleared by the [place] FDA and  has been authorized for detection and/or diagnosis of SARS-CoV-2 by FDA under an Emergency Use Authorization (EUA). This EUA will remain  in effect (meaning this test can be used) for the duration of the COVID-19 declaration under Section 564(b)(1) of the Act, 21 U.S.C.section 360bbb-3(b)(1), unless the authorization is terminated  or revoked sooner.       Influenza A by  PCR NEGATIVE NEGATIVE Final   Influenza B by PCR NEGATIVE NEGATIVE Final    Comment: (NOTE) The Xpert Xpress SARS-CoV-2/FLU/RSV plus assay is intended as an aid in the diagnosis of influenza from Nasopharyngeal swab specimens and should not be used as a sole basis for treatment. Nasal washings and aspirates are unacceptable for Xpert Xpress SARS-CoV-2/FLU/RSV testing.  Fact Sheet for Patients: BloggerCourse.com  Fact Sheet for Healthcare Providers: SeriousBroker.it  This test is not yet approved or  cleared by the [place] FDA and has been authorized for detection and/or diagnosis of SARS-CoV-2 by FDA under an Emergency Use Authorization (EUA). This EUA will remain in effect (meaning this test can be used) for the duration of the COVID-19 declaration under Section 564(b)(1) of the Act, 21 U.S.C. section 360bbb-3(b)(1), unless the authorization is terminated or revoked.  Performed at [facility], [address], [place], [place] [address]   Culture, blood (Routine X 2) w Reflex to ID Panel     Status: None (Preliminary result)   Collection Time: 09/18/21  1:27 AM   Specimen: BLOOD  Result Value Ref Range Status   Specimen Description   Final    BLOOD RIGHT ANTECUBITAL Performed at [facility], [address] [place], [place] [address]    Special Requests   Final    BOTTLES DRAWN AEROBIC AND ANAEROBIC Blood Culture adequate volume Performed at [facility], [address], [place], [place] [address]    Culture   Final    NO GROWTH 1 DAY Performed at [facility], [address] [place], [place] [address]    Report Status PENDING  Incomplete  Culture, blood (Routine X 2) w Reflex to ID Panel     Status: None (Preliminary result)   Collection Time: 09/18/21  5:06 AM   Specimen: BLOOD  Result Value Ref Range Status   Specimen Description   Final    BLOOD BLOOD LEFT HAND Performed at [facility], [address], [place], [place] [address]    Special Requests   Final    BOTTLES DRAWN AEROBIC AND ANAEROBIC Blood Culture adequate volume Performed at [facility], [address] [place], [place] [address]    Culture   Final    NO GROWTH 1 DAY Performed at [facility], [address] [place], [place] [address]    Report Status PENDING  Incomplete     Labs: Basic Metabolic Panel: Recent Labs  Lab 09/17/21 1654 09/18/21 0506  NA 139 139  K 4.2 4.1  CL  104 106  CO2 24 24  GLUCOSE 93 87  BUN 12 13  CREATININE 0.95 0.82  CALCIUM 9.1 8.9   Liver Function Tests: Recent Labs  Lab 09/18/21 0506  AST 25  ALT 32  ALKPHOS 67  BILITOT 0.4  PROT 7.8  ALBUMIN 3.8   No results for input(s): "LIPASE", "AMYLASE" in the last 168 hours. No results for input(s): "AMMONIA" in the last 168 hours. CBC: Recent Labs  Lab 09/17/21 1654 09/18/21 0506  WBC 10.1 9.3  NEUTROABS  --  5.2  HGB 14.1 13.5  HCT 44.7 42.7  MCV 67.3* 67.2*  PLT 220 215   Cardiac Enzymes: Recent Labs  Lab 09/18/21 0127  CKTOTAL 340   BNP: BNP (last 3 results) Recent Labs    09/17/21 1935  BNP 57.0    ProBNP (last 3 results) No results for input(s): "PROBNP" in the  last 8760 hours.  CBG: No results for input(s): "GLUCAP" in the last 168 hours.     Signed:  Zannie Cove MD.  [facility] 09/19/2021, 3:00 PM

## 2021-09-23 LAB — CULTURE, BLOOD (ROUTINE X 2)
Culture: NO GROWTH
Culture: NO GROWTH
Special Requests: ADEQUATE
Special Requests: ADEQUATE

## 2021-10-26 ENCOUNTER — Ambulatory Visit (INDEPENDENT_AMBULATORY_CARE_PROVIDER_SITE_OTHER): Payer: 59 | Admitting: Cardiovascular Disease

## 2021-10-26 ENCOUNTER — Encounter: Payer: Self-pay | Admitting: Cardiovascular Disease

## 2021-10-26 VITALS — BP 132/76 | HR 89 | Ht 66.0 in | Wt 337.2 lb

## 2021-10-26 DIAGNOSIS — R079 Chest pain, unspecified: Secondary | ICD-10-CM | POA: Diagnosis not present

## 2021-10-26 DIAGNOSIS — R072 Precordial pain: Secondary | ICD-10-CM

## 2021-10-26 MED ORDER — METOPROLOL TARTRATE 100 MG PO TABS: 100.0000 mg | ORAL_TABLET | Freq: Once | ORAL | 0 refills | Status: DC

## 2021-10-26 NOTE — Patient Instructions (Addendum)
Medication Instructions:  No changes *If you need a refill on your cardiac medications before your next appointment, please call your pharmacy*   Lab Work: none If you have labs (blood work) drawn today and your tests are completely normal, you will receive your results only by: MyChart Message (if you have MyChart) OR A paper copy in the mail If you have any lab test that is abnormal or we need to change your treatment, we will call you to review the results.   Testing/Procedures: Cardiac CTA - see instructions below   Follow-Up: AS NEEDED  Other Instructions   Your cardiac CT will be scheduled at  [facility] [address] [place], [place] [phone]  Please arrive at the [facility] Entr[facility] (Entrance C2) of [facility] 30 minutes prior to test start time. You can use the FREE valet parking offered at entrance C (encouraged to control the heart rate for the test)  Proceed to the [facility] Radiology Department (first floor) to check-in and test prep.  All radiology patients and guests should use entrance C2 at [facility], accessed from [facility], even though the hospital's physical address listed is [address]    Please follow these instructions carefully (unless otherwise directed):  Hold all erectile dysfunction medications at least 3 days (72 hrs) prior to test.  On the Night Before the Test: Be sure to Drink plenty of water. Do not consume any caffeinated/decaffeinated beverages or chocolate 12 hours prior to your test. Do not take any antihistamines 12 hours prior to your test.  On the Day of the Test: Drink plenty of water until 1 hour prior to the test. Do not eat any food 4 hours prior to the test. You may take your regular medications prior to the test.  Take metoprolol (Lopressor) two hours prior to test.      After the Test: Drink plenty of water. After receiving IV  contrast, you may experience a mild flushed feeling. This is normal. On occasion, you may experience a mild rash up to 24 hours after the test. This is not dangerous. If this occurs, you can take Benadryl 25 mg and increase your fluid intake. If you experience trouble breathing, this can be serious. If it is severe call 911 IMMEDIATELY. If it is mild, please call our office. If you take any of these medications: Glipizide/Metformin, Avandament, Glucavance, please do not take 48 hours after completing test unless otherwise instructed.  We will call to schedule your test 2-4 weeks out understanding that some insurance companies will need an authorization prior to the service being performed.   For non-scheduling related questions, please contact the cardiac imaging nurse navigator should you have any questions/concerns: [place], Cardiac Imaging Nurse Navigator Larey Brick, Cardiac Imaging Nurse Navigator [facility] Direct Office Dial: [phone]   For scheduling needs, including cancellations and rescheduling, please call [place], [phone].

## 2021-10-26 NOTE — Progress Notes (Signed)
Chief Complaint  Patient presents with   New Patient (Initial Visit)    Pericarditis    History of Present Illness: 31 yo male with history of tobacco abuse, marijuana use, HTN and migraine headaches who is here today for hospital follow up. He was seen at [facility] 09/18/21 by Dr. Jens Som. In the ER BP 162/120, pulse 91, Temp 99 F. RR 22, 100% O2. Labs showed WBC 10.1, Hgb 14.1, normal BMET. HS trop 304>325>267>225. BNP 57. Chest x-ray negative. CTA chest was negative for PE. EKG showed ST with nonspecific ST changes.  He was admitted for observation for possible pericarditis. Echo 09/18/21 with LVEF=55-60%. No wall motion abnormalities. No valve disease. Dr. Jens Som felt that this was possibly low grade myocarditis.   He is here today for follow up. The patient denies any chest pain, dyspnea, palpitations, lower extremity edema, orthopnea, PND, dizziness, near syncope or syncope. He feels back to baseline.   Primary Care Physician: Pcp, No  [facility]: [place]  Past Medical History:  Diagnosis Date   Back pain    Hypertension    Migraine     Past Surgical History:  Procedure Laterality Date   HIP SURGERY     SHOULDER SURGERY     left    Current Outpatient Medications  Medication Sig Dispense Refill   metoprolol tartrate (LOPRESSOR) 100 MG tablet Take 1 tablet (100 mg total) by mouth once for 1 dose. Take 90-120 minutes prior to scan. 1 tablet 0   No current facility-administered medications for this visit.    No Known Allergies  Social History   Socioeconomic History   Marital status: Single    Spouse name: Not on file   Number of children: Not on file   Years of education: Not on file   Highest education level: Not on file  Occupational History   Not on file  Tobacco Use   Smoking status: Every Day    Packs/day: 0.15    Types: Cigarettes   Smokeless tobacco: Never  Vaping Use   Vaping Use: Never used  Substance and Sexual Activity   Alcohol use: Yes     Comment: social   Drug use: Yes    Types: Marijuana   Sexual activity: Not Currently  Other Topics Concern   Not on file  Social History Narrative   Not on file   Social Determinants of Health   Financial Resource Strain: Not on file  Food Insecurity: Not on file  Transportation Needs: Not on file  Physical Activity: Not on file  Stress: Not on file  Social Connections: Not on file  Intimate Partner Violence: Not on file    Family History  Problem Relation Age of Onset   Cancer Other    Diabetes Other    Diabetes Mother    Hypertension Mother     Review of Systems:  As stated in the HPI and otherwise negative.   BP 132/76   Pulse 89   Ht 5\' 6"  (1.676 m)   Wt (!) 337 lb 3.2 oz (153 kg)   SpO2 97%   BMI 54.43 kg/m   Physical Examination: General: Well developed, well nourished, NAD  HEENT: OP clear, mucus membranes moist  SKIN: warm, dry. No rashes. Neuro: No focal deficits  Musculoskeletal: Muscle strength 5/5 all ext  Psychiatric: Mood and affect normal  Neck: No JVD, no carotid bruits, no thyromegaly, no lymphadenopathy.  Lungs:Clear bilaterally, no wheezes, rhonci, crackles Cardiovascular: Regular rate and rhythm.  No murmurs, gallops or rubs. Abdomen:Soft. Bowel sounds present. Non-tender.  Extremities: No lower extremity edema. Pulses are 2 + in the bilateral DP/PT.  EKG:  EKG is not ordered today. The ekg ordered today demonstrates   Echo 09/18/21:  1. Left ventricular ejection fraction, by estimation, is 55 to 60%. The  left ventricle has normal function. The left ventricle has no regional  wall motion abnormalities. Left ventricular diastolic parameters were  normal.   2. Right ventricular systolic function is normal. The right ventricular  size is normal.   3. Left atrial size was mildly dilated.   4. The mitral valve is normal in structure. Trivial mitral valve  regurgitation. No evidence of mitral stenosis.   5. The aortic valve is tricuspid.  Aortic valve regurgitation is not  visualized. No aortic stenosis is present.   6. The inferior vena cava is normal in size with greater than 50%  respiratory variability, suggesting right atrial pressure of 3 mmHg.   Recent Labs: 09/17/2021: B Natriuretic Peptide 57.0 09/18/2021: ALT 32; BUN 13; Creatinine, Ser 0.82; Hemoglobin 13.5; Platelets 215; Potassium 4.1; Sodium 139; TSH 4.331   Lipid Panel    Component Value Date/Time   CHOL 155 09/18/2021 1030   TRIG 112 09/18/2021 1030   HDL 25 (L) 09/18/2021 1030   CHOLHDL 6.2 09/18/2021 1030   VLDL 22 09/18/2021 1030   LDLCALC 108 (H) 09/18/2021 1030     Wt Readings from Last 3 Encounters:  10/26/21 (!) 337 lb 3.2 oz (153 kg)  09/17/21 (!) 330 lb (149.7 kg)  05/29/21 (!) 326 lb (147.9 kg)    Assessment and Plan:   1. Chest pain: Resolved. His elevated troponin last month in the setting of febrile illness was probably due to myocarditis. He is feeling back to baseline. No chest pain. Per plans of Dr. Jens Som, will arrange a cardiac CTA to exclude CAD. He will need a beta blocker prior to the scan.   Labs/ tests ordered today include:   Orders Placed This Encounter  Procedures   CT CORONARY MORPH W/CTA COR W/SCORE W/CA W/CM &/OR WO/CM   Disposition:   F/U with Dr. Jens Som as needed.   Signed, Verne Carrow, MD 10/26/2021 3:59 PM    [facility] [address] [place], [place], [place]  [address] Phone: [phone]; Fax: [phone]

## 2021-12-05 ENCOUNTER — Encounter (HOSPITAL_COMMUNITY): Payer: Self-pay

## 2022-06-12 ENCOUNTER — Ambulatory Visit: Payer: Self-pay | Admitting: Nurse Practitioner

## 2023-02-02 ENCOUNTER — Emergency Department (HOSPITAL_COMMUNITY)
Admission: EM | Admit: 2023-02-02 | Discharge: 2023-02-02 | Disposition: A | Discharge status: Home / Self Care | Payer: 59 | Attending: Emergency Medicine | Admitting: Emergency Medicine

## 2023-02-02 DIAGNOSIS — H5789 Other specified disorders of eye and adnexa: Secondary | ICD-10-CM | POA: Diagnosis not present

## 2023-02-02 DIAGNOSIS — H1032 Unspecified acute conjunctivitis, left eye: Secondary | ICD-10-CM | POA: Insufficient documentation

## 2023-02-02 MED ORDER — POLYMYXIN B-TRIMETHOPRIM 10000-0.1 UNIT/ML-% OP SOLN
1.0000 [drp] | Freq: Four times a day (QID) | OPHTHALMIC | Status: DC
Start: 2023-02-02 — End: 2023-02-02
  Filled 2023-02-02: qty 10

## 2023-02-02 NOTE — Discharge Instructions (Addendum)
Recommend Zyrtec daily for allergies. This should provide relief from symptoms of eye redness/itching, runny nose, sneezing.   Because you work in [employer], you can use Polytrim eye drops (1 drop every 6 hours for 7 days) to insure there is no bacterial component to the eye swelling.

## 2023-02-02 NOTE — ED Provider Notes (Signed)
  [place] EMERGENCY DEPARTMENT AT [facility] Provider Note   CSN: [id number] Arrival date & time: 02/02/23  1327     History  No chief complaint on file.   Samuel Greer is a 32 y.o. male.  Patient to ED with redness, itching and swelling of the left eye for the past 2 days. No purulent drainage or matting. No pain with movement. No fever or visual change. He reports increased sneezing, rhinorrhea. No right eye involvement.   The history is provided by the patient. No language interpreter was used.       Home Medications Prior to Admission medications   Medication Sig Start Date End Date Taking? Authorizing Provider  metoprolol tartrate (LOPRESSOR) 100 MG tablet Take 1 tablet (100 mg total) by mouth once for 1 dose. Take 90-120 minutes prior to scan. 10/26/21 10/26/21  Kathleene Hazel, MD      Allergies    Patient has no known allergies.    Review of Systems   Review of Systems  Physical Exam Updated Vital Signs BP (!) 151/94   Pulse 80   Temp 98.3 F (36.8 C) (Oral)   Resp 18   SpO2 98%  Physical Exam Vitals and nursing note reviewed.  HENT:     Head: Normocephalic.     Nose: Rhinorrhea present.     Mouth/Throat:     Mouth: Mucous membranes are moist.  Eyes:     Extraocular Movements: Extraocular movements intact.     Pupils: Pupils are equal, round, and reactive to light.     Comments: There is mild redness and dryness of lower left eye lid. Nontender. Conjunctiva is red without swelling. PERRL.   Cardiovascular:     Rate and Rhythm: Normal rate.  Pulmonary:     Effort: Pulmonary effort is normal.  Musculoskeletal:        General: Normal range of motion.  Skin:    General: Skin is warm and dry.     Findings: No rash.  Neurological:     Mental Status: He is alert.     ED Results / Procedures / Treatments   Labs (all labs ordered are listed, but only abnormal results are displayed) Labs Reviewed - No data to  display  EKG None  Radiology No results found.  Procedures Procedures    Medications Ordered in ED Medications - No data to display  ED Course/ Medical Decision Making/ A&P Clinical Course as of 02/03/23 1528  Sat Feb 02, 2023  1526 Patient to ED with eye irritation. Symptoms and exam findings c/w allergic conjunctivitis. He states he works in [employer], [employer]. Will provide antibiotic drops given his employment situation. Note provided for him to return to work. Eye drops provided. Recommended daily Zyrtec.  [SU]    Clinical Course User Index [SU] Elpidio Anis, PA-C                                 Medical Decision Making          Final Clinical Impression(s) / ED Diagnoses Final diagnoses:  Acute conjunctivitis of left eye, unspecified acute conjunctivitis type    Rx / DC Orders ED Discharge Orders     None         Elpidio Anis, PA-C 02/03/23 1528    Franne Forts, DO 02/08/23 2354

## 2023-02-02 NOTE — ED Triage Notes (Signed)
Patient complains of left eye pain and redness x 2 days.

## 2023-02-05 ENCOUNTER — Emergency Department (HOSPITAL_COMMUNITY)
Admission: EM | Admit: 2023-02-05 | Discharge: 2023-02-05 | Discharge status: Against Medical Advice | Payer: 59 | Attending: Emergency Medicine | Admitting: Emergency Medicine

## 2023-02-05 ENCOUNTER — Other Ambulatory Visit: Payer: Self-pay

## 2023-02-05 ENCOUNTER — Encounter (HOSPITAL_COMMUNITY): Payer: Self-pay | Admitting: Emergency Medicine

## 2023-02-05 DIAGNOSIS — Z5321 Procedure and treatment not carried out due to patient leaving prior to being seen by health care provider: Secondary | ICD-10-CM | POA: Diagnosis not present

## 2023-02-05 DIAGNOSIS — H5789 Other specified disorders of eye and adnexa: Secondary | ICD-10-CM | POA: Insufficient documentation

## 2023-02-05 DIAGNOSIS — Z1152 Encounter for screening for COVID-19: Secondary | ICD-10-CM | POA: Diagnosis not present

## 2023-02-05 LAB — RESP PANEL BY RT-PCR (RSV, FLU A&B, COVID)  RVPGX2
Influenza A by PCR: NEGATIVE
Influenza B by PCR: NEGATIVE
Resp Syncytial Virus by PCR: NEGATIVE
SARS Coronavirus 2 by RT PCR: NEGATIVE

## 2023-02-05 NOTE — ED Triage Notes (Signed)
Pt here for R eye drainage and irritation, states that he was seen recently for the same in his L eye and irritation and drainage is now in both eyes. Reports worsening cough and congestion.

## 2024-02-15 ENCOUNTER — Encounter (HOSPITAL_COMMUNITY): Payer: Self-pay | Admitting: *Deleted

## 2024-02-15 ENCOUNTER — Emergency Department (HOSPITAL_COMMUNITY)
Admission: EM | Admit: 2024-02-15 | Discharge: 2024-02-16 | Disposition: A | Discharge status: Home / Self Care | Payer: Self-pay | Attending: Emergency Medicine | Admitting: Emergency Medicine

## 2024-02-15 ENCOUNTER — Other Ambulatory Visit: Payer: Self-pay

## 2024-02-15 ENCOUNTER — Emergency Department (HOSPITAL_COMMUNITY): Payer: Self-pay

## 2024-02-15 DIAGNOSIS — R600 Localized edema: Secondary | ICD-10-CM

## 2024-02-15 LAB — COMPREHENSIVE METABOLIC PANEL WITH GFR
ALT: 22 U/L (ref 0–44)
AST: 27 U/L (ref 15–41)
Albumin: 4.1 g/dL (ref 3.5–5.0)
Alkaline Phosphatase: 93 U/L (ref 38–126)
Anion gap: 11 (ref 5–15)
BUN: 12 mg/dL (ref 6–20)
CO2: 26 mmol/L (ref 22–32)
Calcium: 9.4 mg/dL (ref 8.9–10.3)
Chloride: 105 mmol/L (ref 98–111)
Creatinine, Ser: 0.89 mg/dL (ref 0.61–1.24)
GFR, Estimated: >60 mL/min (ref >60)
Glucose, Bld: 114 mg/dL — ABNORMAL HIGH (ref 70–99)
Potassium: 3.9 mmol/L (ref 3.5–5.1)
Sodium: 142 mmol/L (ref 135–145)
Total Bilirubin: 0.3 mg/dL (ref 0.0–1.2)
Total Protein: 7.5 g/dL (ref 6.5–8.1)

## 2024-02-15 LAB — PRO BRAIN NATRIURETIC PEPTIDE: Pro Brain Natriuretic Peptide: 63 pg/mL (ref <300.0)

## 2024-02-15 NOTE — ED Triage Notes (Signed)
 Pt here for ankle swelling for the past several days. Took a fluid pill that was his moms that helped. Now c/o tightness from ankle to mid calf. Reports right ankle was more sensitive to touch and painful that left ankle last night .    Hx of htn; reports he hasn't had access to medications and his mother has been giving him medications to help with his blood pressure

## 2024-02-15 NOTE — ED Provider Notes (Signed)
  EMERGENCY DEPARTMENT AT [facility] Provider Note   CSN: [id number] Arrival date & time: 02/15/24  2114     Patient presents with: Leg Swelling   Samuel Greer is a 33 y.o. male.  {Add pertinent medical, surgical, social history, OB history to YEP:67052} The history is provided by the patient and medical records.    33 year old male with history of hypertension, presenting to the ED for ankle swelling.  This has been ongoing for about 2 weeks now.  States his mother gave him one of her fluid pills which seemed to help quite a bit.  He is also elevating his legs when not working which is helping as well.  He denies any significant shortness of breath.  No chest pain.  He is supposed to be taking metoprolol , however still taking pills leftover from his 2023 prescription.  States he usually only takes this every few days as he was trying to stretch his medicine.  Pressures here are elevated.  He does report some increased stress at work, working 12 to 13-hour days up to 6 days a week recently.  No prior hx of CHF.  He is a daily smoker.  Prior to Admission medications  Medication Sig Start Date End Date Taking? Authorizing Provider  metoprolol  tartrate (LOPRESSOR ) 100 MG tablet Take 1 tablet (100 mg total) by mouth once for 1 dose. Take 90-120 minutes prior to scan. 10/26/21 10/26/21  Verlin Lonni BIRCH, MD    Allergies: Patient has no known allergies.    Review of Systems  Cardiovascular:  Positive for leg swelling.  All other systems reviewed and are negative.   Updated Vital Signs BP (!) 169/95 (BP Location: Right Arm)   Pulse 77   Temp 98.3 F (36.8 C) (Oral)   Resp 14   SpO2 98%   Physical Exam Vitals and nursing note reviewed.  Constitutional:      Appearance: He is well-developed.  HENT:     Head: Normocephalic and atraumatic.  Eyes:     Conjunctiva/sclera: Conjunctivae normal.     Pupils: Pupils are equal, round, and reactive to light.   Cardiovascular:     Rate and Rhythm: Normal rate and regular rhythm.     Heart sounds: Normal heart sounds.  Pulmonary:     Effort: Pulmonary effort is normal.     Breath sounds: Normal breath sounds.  Abdominal:     General: Bowel sounds are normal.     Palpations: Abdomen is soft.  Musculoskeletal:        General: Normal range of motion.     Cervical back: Normal range of motion.     Comments: Trace edema at ankles, no calf tenderness, asymmetry, or palpable cords, negative Homans' sign  Skin:    General: Skin is warm and dry.  Neurological:     Mental Status: He is alert and oriented to person, place, and time.     (all labs ordered are listed, but only abnormal results are displayed) Labs Reviewed  CBC WITH DIFFERENTIAL/PLATELET - Abnormal; Notable for the following components:      Result Value   RBC 6.41 (*)    MCV 68.5 (*)    MCH 21.4 (*)    RDW 17.8 (*)    All other components within normal limits  COMPREHENSIVE METABOLIC PANEL WITH GFR - Abnormal; Notable for the following components:   Glucose, Bld 114 (*)    All other components within normal limits  PRO BRAIN NATRIURETIC  PEPTIDE  URINALYSIS, W/ REFLEX TO CULTURE (INFECTION SUSPECTED)    EKG: None  Radiology: DG Chest 2 View Result Date: 02/15/2024 EXAM: 2 VIEW(S) XRAY OF THE CHEST 02/15/2024 10:46:00 PM COMPARISON: Comparison date chest x ray 09/17/2021. CLINICAL HISTORY: SOB, ankle swelling FINDINGS: LUNGS AND PLEURA: No focal pulmonary opacity. No pleural effusion. No pneumothorax. HEART AND MEDIASTINUM: No acute abnormality of the cardiac and mediastinal silhouettes. BONES AND SOFT TISSUES: No acute osseous abnormality. IMPRESSION: 1. No acute cardiopulmonary process. Electronically signed by: Greig Pique MD 02/15/2024 10:51 PM EST RP Workstation: [id number]    {Document cardiac monitor, telemetry assessment procedure when appropriate:32947} Procedures   Medications Ordered in the ED - No data to  display    {Click here for ABCD2, HEART and other calculators REFRESH Note before signing:1}                              Medical Decision Making Amount and/or Complexity of Data Reviewed Labs: ordered. Radiology: ordered.   33 year old male presenting to the ED with ankle swelling for the past 2 weeks.  Was transiently resolved by one of his mothers diuretics.  Has been noncompliant with his blood pressure medications for the past 2 years.  He is afebrile and nontoxic in appearance here.  He is hypertensive.  He denies any chest pain, headaches, or significant shortness of breath.  He does have trace edema at the ankles but no calf asymmetry, tenderness, or palpable cords.  His vitals are stable on room air.  Labs were obtained without leukocytosis or electrolyte derangement.  Normal renal function.  BNP is within normal limits.  Chest x-ray is clear.  Suspect his ankle edema is likely from untreated hypertension.  He also stands for 12 to 13 hours a day when working.  Recommended to try some compression stockings and we will restart his BP medications.  Does not currently have PCP or health insurance so will refer to wellness clinic.  Final diagnoses:  None    ED Discharge Orders     None

## 2024-02-16 LAB — CBC WITH DIFFERENTIAL/PLATELET
Abs Immature Granulocytes: 0.01 K/uL (ref 0.00–0.07)
Basophils Absolute: 0.1 K/uL (ref 0.0–0.1)
Basophils Relative: 1 %
Eosinophils Absolute: 0.3 K/uL (ref 0.0–0.5)
Eosinophils Relative: 4 %
HCT: 43.9 % (ref 39.0–52.0)
Hemoglobin: 13.7 g/dL (ref 13.0–17.0)
Immature Granulocytes: 0 %
Lymphocytes Relative: 23 %
Lymphs Abs: 1.9 K/uL (ref 0.7–4.0)
MCH: 21.4 pg — ABNORMAL LOW (ref 26.0–34.0)
MCHC: 31.2 g/dL (ref 30.0–36.0)
MCV: 68.5 fL — ABNORMAL LOW (ref 80.0–100.0)
Monocytes Absolute: 0.7 K/uL (ref 0.1–1.0)
Monocytes Relative: 8 %
Neutro Abs: 5.3 K/uL (ref 1.7–7.7)
Neutrophils Relative %: 64 %
Platelets: 282 K/uL (ref 150–400)
RBC: 6.41 MIL/uL — ABNORMAL HIGH (ref 4.22–5.81)
RDW: 17.8 % — ABNORMAL HIGH (ref 11.5–15.5)
WBC: 8.3 K/uL (ref 4.0–10.5)
nRBC: 0 % (ref 0.0–0.2)

## 2024-02-16 MED ORDER — METOPROLOL TARTRATE 25 MG PO TABS: 25.0000 mg | ORAL_TABLET | Freq: Every day | ORAL | 0 refills | Status: DC

## 2024-02-16 NOTE — Discharge Instructions (Addendum)
 Take the prescribed medication as directed.  Use coupon printed to help with affordability of your medications. Follow-up with the wellness clinic-- this is the free clinic we talked about.  Call in the morning to get this scheduled. Return to the ED for new or worsening symptoms.

## 2024-03-07 ENCOUNTER — Other Ambulatory Visit: Payer: Self-pay

## 2024-03-07 ENCOUNTER — Emergency Department (HOSPITAL_COMMUNITY)
Admission: EM | Admit: 2024-03-07 | Discharge: 2024-03-08 | Disposition: A | Payer: Self-pay | Attending: Emergency Medicine | Admitting: Emergency Medicine

## 2024-03-07 DIAGNOSIS — Y990 Civilian activity done for income or pay: Secondary | ICD-10-CM | POA: Diagnosis not present

## 2024-03-07 DIAGNOSIS — S39012A Strain of muscle, fascia and tendon of lower back, initial encounter: Secondary | ICD-10-CM | POA: Diagnosis not present

## 2024-03-07 DIAGNOSIS — I10 Essential (primary) hypertension: Secondary | ICD-10-CM | POA: Diagnosis not present

## 2024-03-07 DIAGNOSIS — Z79899 Other long term (current) drug therapy: Secondary | ICD-10-CM | POA: Insufficient documentation

## 2024-03-07 DIAGNOSIS — Z711 Person with feared health complaint in whom no diagnosis is made: Secondary | ICD-10-CM

## 2024-03-07 DIAGNOSIS — X501XXA Overexertion from prolonged static or awkward postures, initial encounter: Secondary | ICD-10-CM | POA: Insufficient documentation

## 2024-03-07 DIAGNOSIS — Z202 Contact with and (suspected) exposure to infections with a predominantly sexual mode of transmission: Secondary | ICD-10-CM | POA: Diagnosis not present

## 2024-03-07 DIAGNOSIS — S3992XA Unspecified injury of lower back, initial encounter: Secondary | ICD-10-CM | POA: Diagnosis present

## 2024-03-07 MED ORDER — NAPROXEN 500 MG PO TABS: 500.0000 mg | ORAL_TABLET | Freq: Two times a day (BID) | ORAL | 0 refills | Status: DC

## 2024-03-07 MED ORDER — METHOCARBAMOL 500 MG PO TABS: 500.0000 mg | ORAL_TABLET | Freq: Two times a day (BID) | ORAL | 0 refills | Status: DC | PRN

## 2024-03-07 MED ORDER — KETOROLAC TROMETHAMINE 15 MG/ML IJ SOLN
15.0000 mg | Freq: Once | INTRAMUSCULAR | Status: AC
  Administered 2024-03-08: 15 mg via INTRAMUSCULAR
  Filled 2024-03-07: qty 1

## 2024-03-07 MED ORDER — METOPROLOL TARTRATE 25 MG PO TABS: 25.0000 mg | ORAL_TABLET | Freq: Every day | ORAL | 0 refills | Status: AC

## 2024-03-07 MED ORDER — METOPROLOL TARTRATE 25 MG PO TABS
25.0000 mg | ORAL_TABLET | Freq: Once | ORAL | Status: AC
  Administered 2024-03-07: 25 mg via ORAL
  Filled 2024-03-07: qty 1

## 2024-03-07 NOTE — ED Triage Notes (Signed)
 Pt states that he was taking out heavy trash at work a few days ago and woke up the next morning with low back pain that worsens with bending and twisting. . Pt is hypertensive in triage but reports that he has missed several days of his metoprolol .

## 2024-03-08 NOTE — Discharge Instructions (Signed)
 Alternate ice and heat to areas of injury 3-4 times per day to limit inflammation and spasm.  Avoid strenuous activity and heavy lifting.  We recommend consistent use of naproxen  in addition to Robaxin  for muscle spasms.  Do not drive or drink alcohol after taking Robaxin  as it may make you drowsy and impair your judgment.    Continue your daily metoprolol  as prescribed.  We recommend follow-up with a primary care doctor to ensure resolution of symptoms.  Return to the ED for any new or concerning symptoms.

## 2024-03-08 NOTE — ED Provider Notes (Signed)
 " [place] EMERGENCY DEPARTMENT AT [facility] Provider Note   CSN: [id number] Arrival date & time: 03/07/24  1907     Patient presents with: Back Pain   Samuel Greer is a 34 y.o. male.   34 year old male with a history of hypertension and back pain presents to the emergency department for pain in his low back.  States that he has not had pain for over a year, but was taking out some heavy trash at work a few days ago and woke up the next morning with discomfort.  States that it is worse when he is sedentary for long periods of time as well as when bending.  Does not have as much discomfort with ambulation.  He tried a tablet of his mom's Celebrex with some improvement.  Has not had any fevers, genital or perianal numbness, bowel or bladder incontinence, extremity numbness or paresthesias, extremity weakness.  Denies a history of cancer himself.  No history of IV drug use.  As an aside, requesting STD test. Denies penile discharge, dysuria, hematuria, genital lesions.  The history is provided by the patient. No language interpreter was used.  Back Pain      Prior to Admission medications  Medication Sig Start Date End Date Taking? Authorizing Provider  methocarbamol  (ROBAXIN ) 500 MG tablet Take 1 tablet (500 mg total) by mouth every 12 (twelve) hours as needed for muscle spasms. 03/07/24  Yes Keith Sor, PA-C  naproxen  (NAPROSYN ) 500 MG tablet Take 1 tablet (500 mg total) by mouth 2 (two) times daily. 03/07/24  Yes Keith Sor, PA-C  metoprolol  tartrate (LOPRESSOR ) 25 MG tablet Take 1 tablet (25 mg total) by mouth daily. 03/07/24   Keith Sor, PA-C    Allergies: Patient has no known allergies.    Review of Systems  Musculoskeletal:  Positive for back pain.  Ten systems reviewed and are negative for acute change, except as noted in the HPI.    Updated Vital Signs BP (!) 218/119 (BP Location: Right Arm)   Pulse 67   Temp 98.3 F (36.8 C) (Oral)   Resp 17   SpO2  99%   Physical Exam Vitals and nursing note reviewed.  Constitutional:      General: He is not in acute distress.    Appearance: He is well-developed. He is not diaphoretic.     Comments: Obese, pleasant AA male.  HENT:     Head: Normocephalic and atraumatic.  Eyes:     General: No scleral icterus.    Conjunctiva/sclera: Conjunctivae normal.  Cardiovascular:     Rate and Rhythm: Normal rate and regular rhythm.     Pulses: Normal pulses.  Pulmonary:     Effort: Pulmonary effort is normal. No respiratory distress.     Comments: Respirations even and unlabored Musculoskeletal:        General: Normal range of motion.     Cervical back: Normal range of motion.     Comments: TTP to the lumbar midline ~ L3/4.  Mild newness to the associated paraspinal muscles bilaterally.  No bony deformities, step-offs, crepitus.  Skin:    General: Skin is warm and dry.     Coloration: Skin is not pale.     Findings: No erythema or rash.  Neurological:     Mental Status: He is alert and oriented to person, place, and time.     Coordination: Coordination normal.     Comments: Able to transition in the bed without issue. Ambulatory independently  in the department.  Psychiatric:        Behavior: Behavior normal.     (all labs ordered are listed, but only abnormal results are displayed) Labs Reviewed  GC/CHLAMYDIA PROBE AMP ([place]) NOT AT [facility]    EKG: None  Radiology: No results found.   Procedures   Medications Ordered in the ED  ketorolac  (TORADOL ) 15 MG/ML injection 15 mg (has no administration in time range)  metoprolol  tartrate (LOPRESSOR ) tablet 25 mg (25 mg Oral Given 03/07/24 2355)                                    Medical Decision Making Risk Prescription drug management.   This patient presents to the ED for concern of back pain, this involves an extensive number of treatment options, and is a complaint that carries with it a high risk of complications and  morbidity.  The differential diagnosis includes MSK strain vs fracture vs kidney stone vs cauda equina   Co morbidities that complicate the patient evaluation  HTN Back pain   Additional history obtained:  Additional history obtained from medical records External records from outside source obtained and reviewed including prior discharge summaries   Lab Tests:  I Ordered, and personally interpreted labs.  The pertinent results include:  GC/Chlamydia pending.   Cardiac Monitoring:  The patient was maintained on a cardiac monitor.  I personally viewed and interpreted the cardiac monitored which showed an underlying rhythm of: NSR   Medicines ordered and prescription drug management:  I ordered medication including Toradol  for pain, Metoprolol  for HTN  Reevaluation of the patient after these medicines showed that the patient improved I have reviewed the patients home medicines and have made adjustments as needed   Test Considered:  Xray lumbar spine - felt low yield given lack of direct trauma.   Problem List / ED Course:  Patient with back pain.  Hx of same.  Onset after heavy lifting.  Neurovascularly intact and ambulatory.  No loss of bowel or bladder control.  No concern for cauda equina.  No fever, h/o cancer, IVDU.  Symptoms reproducible on exam; consistent with MSK etiology.  Plan supportive care with icing/heat and NSAIDs.  Will give short course of muscle relaxants. Also with secondary concern for STI. Asymptomatic wrt this. GC/Chlamydia pending.  Patient will be notified if this returns positive.   Reevaluation:  After the interventions noted above, I reevaluated the patient and found that they have :stayed the same   Social Determinants of Health:  Lives independently    Dispostion:  After consideration of the diagnostic results and the patients response to treatment, I feel that the patent would benefit from supportive care measures and PCP f/u. Return  precautions discussed and provided. Patient discharged in stable condition with no unaddressed concerns.       Final diagnoses:  Strain of lumbar region, initial encounter  Concern about STD in male without diagnosis  Essential hypertension    ED Discharge Orders          Ordered    metoprolol  tartrate (LOPRESSOR ) 25 MG tablet  Daily        03/07/24 2358    naproxen  (NAPROSYN ) 500 MG tablet  2 times daily        03/07/24 2358    methocarbamol  (ROBAXIN ) 500 MG tablet  Every 12 hours PRN        03/07/24  [address]               Keith Sor, PA-C 03/08/24 0022  "

## 2024-03-09 LAB — GC/CHLAMYDIA PROBE AMP (~~LOC~~) NOT AT ARMC
Chlamydia: NEGATIVE
Comment: NEGATIVE
Comment: NORMAL
Neisseria Gonorrhea: NEGATIVE

## 2024-03-31 ENCOUNTER — Emergency Department (HOSPITAL_BASED_OUTPATIENT_CLINIC_OR_DEPARTMENT_OTHER)

## 2024-03-31 ENCOUNTER — Encounter (HOSPITAL_BASED_OUTPATIENT_CLINIC_OR_DEPARTMENT_OTHER): Payer: Self-pay

## 2024-03-31 ENCOUNTER — Emergency Department (HOSPITAL_BASED_OUTPATIENT_CLINIC_OR_DEPARTMENT_OTHER): Admission: EM | Admit: 2024-03-31 | Discharge: 2024-03-31 | Disposition: A | Discharge status: Home / Self Care

## 2024-03-31 ENCOUNTER — Other Ambulatory Visit: Payer: Self-pay

## 2024-03-31 DIAGNOSIS — M545 Low back pain, unspecified: Secondary | ICD-10-CM | POA: Diagnosis present

## 2024-03-31 DIAGNOSIS — M549 Dorsalgia, unspecified: Secondary | ICD-10-CM

## 2024-03-31 DIAGNOSIS — R109 Unspecified abdominal pain: Secondary | ICD-10-CM | POA: Insufficient documentation

## 2024-03-31 DIAGNOSIS — I10 Essential (primary) hypertension: Secondary | ICD-10-CM | POA: Insufficient documentation

## 2024-03-31 DIAGNOSIS — Z79899 Other long term (current) drug therapy: Secondary | ICD-10-CM | POA: Insufficient documentation

## 2024-03-31 LAB — CBC WITH DIFFERENTIAL/PLATELET
Abs Immature Granulocytes: 0.02 10*3/uL (ref 0.00–0.07)
Basophils Absolute: 0.1 10*3/uL (ref 0.0–0.1)
Basophils Relative: 1 %
Eosinophils Absolute: 0.3 10*3/uL (ref 0.0–0.5)
Eosinophils Relative: 3 %
HCT: 42.7 % (ref 39.0–52.0)
Hemoglobin: 13.7 g/dL (ref 13.0–17.0)
Immature Granulocytes: 0 %
Lymphocytes Relative: 23 %
Lymphs Abs: 1.8 10*3/uL (ref 0.7–4.0)
MCH: 21.3 pg — ABNORMAL LOW (ref 26.0–34.0)
MCHC: 32.1 g/dL (ref 30.0–36.0)
MCV: 66.5 fL — ABNORMAL LOW (ref 80.0–100.0)
Monocytes Absolute: 0.6 10*3/uL (ref 0.1–1.0)
Monocytes Relative: 8 %
Neutro Abs: 5.2 10*3/uL (ref 1.7–7.7)
Neutrophils Relative %: 65 %
Platelets: 274 10*3/uL (ref 150–400)
RBC: 6.42 MIL/uL — ABNORMAL HIGH (ref 4.22–5.81)
RDW: 18.5 % — ABNORMAL HIGH (ref 11.5–15.5)
WBC: 8 10*3/uL (ref 4.0–10.5)
nRBC: 0 % (ref 0.0–0.2)

## 2024-03-31 LAB — COMPREHENSIVE METABOLIC PANEL WITH GFR
ALT: 33 U/L (ref 0–44)
AST: 34 U/L (ref 15–41)
Albumin: 4.5 g/dL (ref 3.5–5.0)
Alkaline Phosphatase: 90 U/L (ref 38–126)
Anion gap: 11 (ref 5–15)
BUN: 15 mg/dL (ref 6–20)
CO2: 27 mmol/L (ref 22–32)
Calcium: 9.7 mg/dL (ref 8.9–10.3)
Chloride: 104 mmol/L (ref 98–111)
Creatinine, Ser: 0.88 mg/dL (ref 0.61–1.24)
GFR, Estimated: >60 mL/min
Glucose, Bld: 92 mg/dL (ref 70–99)
Potassium: 4.4 mmol/L (ref 3.5–5.1)
Sodium: 141 mmol/L (ref 135–145)
Total Bilirubin: 0.3 mg/dL (ref 0.0–1.2)
Total Protein: 7.7 g/dL (ref 6.5–8.1)

## 2024-03-31 LAB — URINALYSIS, ROUTINE W REFLEX MICROSCOPIC
Bacteria, UA: NONE SEEN
Bilirubin Urine: NEGATIVE
Glucose, UA: NEGATIVE mg/dL
Hgb urine dipstick: NEGATIVE
Ketones, ur: NEGATIVE mg/dL
Leukocytes,Ua: NEGATIVE
Nitrite: NEGATIVE
Protein, ur: 30 mg/dL — AB
Specific Gravity, Urine: 1.033 — ABNORMAL HIGH (ref 1.005–1.030)
pH: 6.5 (ref 5.0–8.0)

## 2024-03-31 MED ORDER — METHOCARBAMOL 500 MG PO TABS
500.0000 mg | ORAL_TABLET | Freq: Once | ORAL | Status: AC
  Administered 2024-03-31: 500 mg via ORAL
  Filled 2024-03-31: qty 1

## 2024-03-31 MED ORDER — METHOCARBAMOL 500 MG PO TABS: 500.0000 mg | ORAL_TABLET | Freq: Two times a day (BID) | ORAL | 0 refills | Status: AC | PRN

## 2024-03-31 MED ORDER — NAPROXEN 500 MG PO TABS: 500.0000 mg | ORAL_TABLET | Freq: Two times a day (BID) | ORAL | 0 refills | Status: AC

## 2024-03-31 MED ORDER — KETOROLAC TROMETHAMINE 15 MG/ML IJ SOLN
15.0000 mg | Freq: Once | INTRAMUSCULAR | Status: AC
  Administered 2024-03-31: 15 mg via INTRAVENOUS
  Filled 2024-03-31: qty 1

## 2024-03-31 MED ORDER — KETOROLAC TROMETHAMINE 30 MG/ML IJ SOLN: 30.0000 mg | Freq: Once | INTRAMUSCULAR | Status: DC

## 2024-03-31 NOTE — ED Triage Notes (Signed)
 Complaining of pain in the back that started a few days ago. Was seen at [facility] for it and was given some pain medication. Has ran out of it and the pain is back.

## 2024-03-31 NOTE — ED Provider Notes (Incomplete)
" °  [place] EMERGENCY DEPARTMENT AT [facility] Provider Note   CSN: [id number] Arrival date & time: 03/31/24  1730     Patient presents with: Back Pain   Samuel Greer is a 34 y.o. male who presents with back pain, ongoing for several days.      {Add pertinent medical, surgical, social history, OB history to HPI:32947}  Back Pain      Prior to Admission medications  Medication Sig Start Date End Date Taking? Authorizing Provider  methocarbamol  (ROBAXIN ) 500 MG tablet Take 1 tablet (500 mg total) by mouth every 12 (twelve) hours as needed for muscle spasms. 03/07/24   Keith Sor, PA-C  metoprolol  tartrate (LOPRESSOR ) 25 MG tablet Take 1 tablet (25 mg total) by mouth daily. 03/07/24   Keith Sor, PA-C  naproxen  (NAPROSYN ) 500 MG tablet Take 1 tablet (500 mg total) by mouth 2 (two) times daily. 03/07/24   Keith Sor, PA-C    Allergies: Patient has no known allergies.    Review of Systems  Musculoskeletal:  Positive for back pain.    Updated Vital Signs BP (!) 168/96 (BP Location: Right Wrist)   Pulse 75   Temp 98.9 F (37.2 C)   Resp 20   Ht 5' 6 (1.676 m)   Wt (!) 147 kg   SpO2 98%   BMI 52.29 kg/m   Physical Exam  (all labs ordered are listed, but only abnormal results are displayed) Labs Reviewed - No data to display  EKG: None  Radiology: No results found.  {Document cardiac monitor, telemetry assessment procedure when appropriate:32947} Procedures   Medications Ordered in the ED - No data to display  Clinical Course as of 03/31/24 2054  Tue Mar 31, 2024  1928 Temp: 98.9 F (37.2 C) Afebrile, vital stable, patient no acute distress [ML]  2030 CBC with Differential(!) No acute findings [ML]  2030 Urinalysis, Routine w reflex microscopic -Urine, Clean Catch(!) No acute findings [ML]    Clinical Course User Index [ML] Willma Duwaine CROME, PA   {Click here for ABCD2, HEART and other calculators REFRESH Note before signing:1}                               Medical Decision Making Amount and/or Complexity of Data Reviewed Labs: ordered. Decision-making details documented in ED Course. Radiology: ordered.  Risk Prescription drug management.   ***  {Document critical care time when appropriate  Document review of labs and clinical decision tools ie CHADS2VASC2, etc  Document your independent review of radiology images and any outside records  Document your discussion with family members, caretakers and with consultants  Document social determinants of health affecting pt's care  Document your decision making why or why not admission, treatments were needed:32947:::1}   Final diagnoses:  None    ED Discharge Orders     None        "

## 2024-03-31 NOTE — Discharge Instructions (Addendum)
 Thank you for visiting the Emergency Department today. It was a pleasure to be part of your healthcare team.   Your were seen today for lower back pain  Your robaxin  and naproxen  has been refilled, and you should take your medications as directed. If you have any questions about your medicines, please call your pharmacy or healthcare provider.  At home, rest, ice, heat, use Tylenol  as needed for pain relief, along with muscle relaxers, and naproxen .   It is important to watch for warning signs such as worsening pain or any new/worsening symptoms.  If any of these happen, return to the Emergency Department or call 911.  Thank you for trusting us  with your health.
